# Patient Record
Sex: Male | Born: 1985 | Hispanic: No | Marital: Married | Smoking: Former smoker
Health system: Southern US, Community
[De-identification: ages and names within clinical notes are randomized; demographics above are authoritative.]

## PROBLEM LIST (undated history)

## (undated) DIAGNOSIS — R7303 Prediabetes: Secondary | ICD-10-CM

## (undated) DIAGNOSIS — M549 Dorsalgia, unspecified: Secondary | ICD-10-CM

## (undated) DIAGNOSIS — G8929 Other chronic pain: Secondary | ICD-10-CM

## (undated) DIAGNOSIS — E7800 Pure hypercholesterolemia, unspecified: Secondary | ICD-10-CM

## (undated) HISTORY — DX: Pure hypercholesterolemia, unspecified: E78.00

## (undated) HISTORY — PX: SKIN GRAFT: SHX250

## (undated) HISTORY — DX: Prediabetes: R73.03

---
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
## 2007-06-21 ENCOUNTER — Emergency Department (HOSPITAL_COMMUNITY): Admission: EM | Admit: 2007-06-21 | Discharge: 2007-06-21 | Payer: Self-pay | Admitting: Emergency Medicine

## 2007-09-13 ENCOUNTER — Emergency Department (HOSPITAL_COMMUNITY): Admission: EM | Admit: 2007-09-13 | Discharge: 2007-09-13 | Payer: Self-pay | Admitting: Emergency Medicine

## 2008-04-11 ENCOUNTER — Emergency Department (HOSPITAL_COMMUNITY): Admission: EM | Admit: 2008-04-11 | Discharge: 2008-04-11 | Payer: Self-pay | Admitting: Emergency Medicine

## 2010-02-04 ENCOUNTER — Emergency Department (HOSPITAL_COMMUNITY): Admission: EM | Admit: 2010-02-04 | Discharge: 2010-02-04 | Payer: Self-pay | Admitting: Emergency Medicine

## 2010-07-12 ENCOUNTER — Emergency Department (HOSPITAL_COMMUNITY): Admission: EM | Admit: 2010-07-12 | Discharge: 2010-07-12 | Payer: Self-pay | Admitting: Emergency Medicine

## 2013-04-29 ENCOUNTER — Encounter (HOSPITAL_COMMUNITY): Payer: Self-pay | Admitting: Emergency Medicine

## 2013-04-29 ENCOUNTER — Emergency Department (HOSPITAL_COMMUNITY)
Admission: EM | Admit: 2013-04-29 | Discharge: 2013-04-29 | Disposition: A | Discharge status: Home / Self Care | Payer: BC Managed Care – PPO | Attending: Emergency Medicine | Admitting: Emergency Medicine

## 2013-04-29 ENCOUNTER — Emergency Department (HOSPITAL_COMMUNITY): Payer: BC Managed Care – PPO

## 2013-04-29 DIAGNOSIS — M545 Low back pain, unspecified: Secondary | ICD-10-CM | POA: Insufficient documentation

## 2013-04-29 DIAGNOSIS — G8929 Other chronic pain: Secondary | ICD-10-CM | POA: Insufficient documentation

## 2013-04-29 DIAGNOSIS — F17200 Nicotine dependence, unspecified, uncomplicated: Secondary | ICD-10-CM | POA: Insufficient documentation

## 2013-04-29 HISTORY — DX: Dorsalgia, unspecified: M54.9

## 2013-04-29 HISTORY — DX: Other chronic pain: G89.29

## 2013-04-29 MED ORDER — HYDROCODONE-ACETAMINOPHEN 5-325 MG PO TABS: ORAL_TABLET | ORAL | Status: DC

## 2013-04-29 MED ORDER — NAPROXEN 250 MG PO TABS: 250.0000 mg | ORAL_TABLET | Freq: Two times a day (BID) | ORAL | Status: DC

## 2013-04-29 MED ORDER — METHOCARBAMOL 500 MG PO TABS
1000.0000 mg | ORAL_TABLET | Freq: Four times a day (QID) | ORAL | Status: DC | PRN
Start: 2013-04-29 — End: 2014-04-23

## 2013-04-29 NOTE — ED Notes (Signed)
Pt c/o L sided sciatica, onset after lifting a battery and twisting.

## 2013-04-29 NOTE — ED Provider Notes (Signed)
History     CSN: [id number]  Arrival date & time 04/29/13  1124   First MD Initiated Contact with Patient 04/29/13 1136      Chief Complaint  Patient presents with  . Back Pain     HPI Pt was seen at 1145.  Per pt, c/o gradual onset and persistence of constant acute flair of his chronic low back "pain" for the past several days. States the pain began after he "lifted a battery up from the floor."  Denies any change in his usual chronic pain pattern.  Describes the pain as "spasms."  Pain worsens with palpation of the area and body position changes. Denies incont/retention of bowel or bladder, no saddle anesthesia, no focal motor weakness, no tingling/numbness in extremities, no fevers, no injury, no abd pain.   The symptoms have been associated with no other complaints. The patient has a significant history of similar symptoms previously, recently being evaluated for this complaint and multiple prior evals for same.     Past Medical History  Diagnosis Date  . Chronic back pain     History reviewed. No pertinent past surgical history.  Family History  Problem Relation Age of Onset  . Hypertension Other     History  Substance Use Topics  . Smoking status: Current Every Day Smoker -- 0.50 packs/day    Types: Cigarettes  . Smokeless tobacco: Never Used  . Alcohol Use: 1.8 oz/week    3 Cans of beer per week      Review of Systems ROS: Statement: All systems negative except as marked or noted in the HPI; Constitutional: Negative for fever and chills. ; ; Eyes: Negative for eye pain, redness and discharge. ; ; ENMT: Negative for ear pain, hoarseness, nasal congestion, sinus pressure and sore throat. ; ; Cardiovascular: Negative for chest pain, palpitations, diaphoresis, dyspnea and peripheral edema. ; ; Respiratory: Negative for cough, wheezing and stridor. ; ; Gastrointestinal: Negative for nausea, vomiting, diarrhea, abdominal pain, blood in stool, hematemesis, jaundice and  rectal bleeding. . ; ; Genitourinary: Negative for dysuria, flank pain and hematuria. ; ; Musculoskeletal: +LBP. Negative for neck pain. Negative for swelling and trauma.; ; Skin: Negative for pruritus, rash, abrasions, blisters, bruising and skin lesion.; ; Neuro: Negative for headache, lightheadedness and neck stiffness. Negative for weakness, altered level of consciousness , altered mental status, extremity weakness, paresthesias, involuntary movement, seizure and syncope.       Allergies  Review of patient's allergies indicates no known allergies.  Home Medications   Current Outpatient Rx  Name  Route  Sig  Dispense  Refill  . HYDROcodone-acetaminophen (NORCO/VICODIN) 5-325 MG per tablet      1 or 2 tabs PO q6 hours prn pain   20 tablet   0   . methocarbamol (ROBAXIN) 500 MG tablet   Oral   Take 2 tablets (1,000 mg total) by mouth 4 (four) times daily as needed (muscle spasm/pain).   25 tablet   0   . naproxen (NAPROSYN) 250 MG tablet   Oral   Take 1 tablet (250 mg total) by mouth 2 (two) times daily with a meal.   14 tablet   0     BP 126/91  Pulse 62  Temp(Src) 98.3 F (36.8 C) (Oral)  Resp 16  Ht 5\' 9"  (1.753 m)  Wt 165 lb (74.844 kg)  BMI 24.36 kg/m2  SpO2 100%  Physical Exam 1150: Physical examination:  Nursing notes reviewed; Vital signs and O2 SAT  reviewed;  Constitutional: Well developed, Well nourished, Well hydrated, In no acute distress; Head:  Normocephalic, atraumatic; Eyes: EOMI, PERRL, No scleral icterus; ENMT: Mouth and pharynx normal, Mucous membranes moist; Neck: Supple, Full range of motion, No lymphadenopathy; Cardiovascular: Regular rate and rhythm, No murmur, rub, or gallop; Respiratory: Breath sounds clear & equal bilaterally, No rales, rhonchi, wheezes.  Speaking full sentences with ease, Normal respiratory effort/excursion; Chest: Nontender, Movement normal; Abdomen: Soft, Nontender, Nondistended, Normal bowel sounds; Genitourinary: No CVA  tenderness; Spine:  No midline CS, TS, LS tenderness.  +TTP L>R lumbar paraspinal muscles. No rash.;; Extremities: Pulses normal, No tenderness, No edema, No calf edema or asymmetry.; Neuro: AA&Ox3, Major CN grossly intact.  Speech clear. Strength 5/5 equal bilat UE's and LE's, including great toe dorsiflexion.  DTR 2/4 equal bilat UE's and LE's.  No gross sensory deficits.  Neg straight leg raises bilat. Climbs on and off stretcher easily by himself. Gait steady. No gross focal motor or sensory deficits in extremities.; Skin: Color normal, Warm, Dry.   ED Course  Procedures     MDM  MDM Reviewed: previous chart, nursing note and vitals     1200:  Several year hx of chronic pain.  Pt endorses acute flair of his usual long standing chronic pain today, no change from his usual chronic pain pattern.  Pt encouraged to f/u with his PMD and Pain Management doctor for good continuity of care and control of his chronic pain.  Verb understanding.         Laray Anger, DO 05/02/13 1242

## 2014-04-23 ENCOUNTER — Encounter (HOSPITAL_COMMUNITY): Payer: Self-pay | Admitting: Emergency Medicine

## 2014-04-23 ENCOUNTER — Encounter (HOSPITAL_COMMUNITY): Payer: Self-pay | Admitting: *Deleted

## 2014-04-23 ENCOUNTER — Observation Stay (HOSPITAL_COMMUNITY)
Admission: EM | Admit: 2014-04-23 | Discharge: 2014-04-24 | Disposition: A | Discharge status: Home / Self Care | Payer: BC Managed Care – PPO | Source: Intra-hospital | Attending: Psychiatry | Admitting: Psychiatry

## 2014-04-23 ENCOUNTER — Emergency Department (HOSPITAL_COMMUNITY)
Admission: EM | Admit: 2014-04-23 | Discharge: 2014-04-23 | Disposition: A | Discharge status: Other Acute Inpatient Hospital | Payer: BC Managed Care – PPO | Attending: Emergency Medicine | Admitting: Emergency Medicine

## 2014-04-23 DIAGNOSIS — F1020 Alcohol dependence, uncomplicated: Secondary | ICD-10-CM | POA: Insufficient documentation

## 2014-04-23 DIAGNOSIS — F4310 Post-traumatic stress disorder, unspecified: Secondary | ICD-10-CM | POA: Insufficient documentation

## 2014-04-23 DIAGNOSIS — F4320 Adjustment disorder, unspecified: Secondary | ICD-10-CM | POA: Insufficient documentation

## 2014-04-23 DIAGNOSIS — F17200 Nicotine dependence, unspecified, uncomplicated: Secondary | ICD-10-CM | POA: Insufficient documentation

## 2014-04-23 DIAGNOSIS — F1410 Cocaine abuse, uncomplicated: Secondary | ICD-10-CM | POA: Insufficient documentation

## 2014-04-23 DIAGNOSIS — F332 Major depressive disorder, recurrent severe without psychotic features: Secondary | ICD-10-CM | POA: Insufficient documentation

## 2014-04-23 DIAGNOSIS — G8929 Other chronic pain: Secondary | ICD-10-CM | POA: Insufficient documentation

## 2014-04-23 DIAGNOSIS — F1994 Other psychoactive substance use, unspecified with psychoactive substance-induced mood disorder: Secondary | ICD-10-CM | POA: Insufficient documentation

## 2014-04-23 DIAGNOSIS — F1210 Cannabis abuse, uncomplicated: Secondary | ICD-10-CM | POA: Insufficient documentation

## 2014-04-23 DIAGNOSIS — F1010 Alcohol abuse, uncomplicated: Principal | ICD-10-CM | POA: Insufficient documentation

## 2014-04-23 DIAGNOSIS — M549 Dorsalgia, unspecified: Secondary | ICD-10-CM | POA: Insufficient documentation

## 2014-04-23 LAB — RAPID URINE DRUG SCREEN, HOSP PERFORMED
AMPHETAMINES: NOT DETECTED
Barbiturates: NOT DETECTED
Benzodiazepines: NOT DETECTED
COCAINE: POSITIVE — AB
OPIATES: NOT DETECTED
TETRAHYDROCANNABINOL: POSITIVE — AB

## 2014-04-23 LAB — COMPREHENSIVE METABOLIC PANEL
ALBUMIN: 4.1 g/dL (ref 3.5–5.2)
ALT: 17 U/L (ref 0–53)
AST: 17 U/L (ref 0–37)
Alkaline Phosphatase: 69 U/L (ref 39–117)
BILIRUBIN TOTAL: 0.4 mg/dL (ref 0.3–1.2)
BUN: 17 mg/dL (ref 6–23)
CALCIUM: 9.4 mg/dL (ref 8.4–10.5)
CHLORIDE: 102 meq/L (ref 96–112)
CO2: 23 meq/L (ref 19–32)
Creatinine, Ser: 0.98 mg/dL (ref 0.50–1.35)
GFR calc Af Amer: >90 mL/min (ref >90)
Glucose, Bld: 90 mg/dL (ref 70–99)
Potassium: 3.8 mEq/L (ref 3.7–5.3)
Sodium: 139 mEq/L (ref 137–147)
TOTAL PROTEIN: 7.5 g/dL (ref 6.0–8.3)

## 2014-04-23 LAB — CBC WITH DIFFERENTIAL/PLATELET
Basophils Absolute: 0 10*3/uL (ref 0.0–0.1)
Basophils Relative: 0 % (ref 0–1)
EOS PCT: 1 % (ref 0–5)
Eosinophils Absolute: 0.1 10*3/uL (ref 0.0–0.7)
HCT: 45.2 % (ref 39.0–52.0)
HEMOGLOBIN: 15.6 g/dL (ref 13.0–17.0)
LYMPHS PCT: 25 % (ref 12–46)
Lymphs Abs: 1.7 10*3/uL (ref 0.7–4.0)
MCH: 32.2 pg (ref 26.0–34.0)
MCHC: 34.5 g/dL (ref 30.0–36.0)
MCV: 93.4 fL (ref 78.0–100.0)
Monocytes Absolute: 0.5 10*3/uL (ref 0.1–1.0)
Monocytes Relative: 7 % (ref 3–12)
Neutro Abs: 4.7 10*3/uL (ref 1.7–7.7)
Neutrophils Relative %: 67 % (ref 43–77)
PLATELETS: 209 10*3/uL (ref 150–400)
RBC: 4.84 MIL/uL (ref 4.22–5.81)
RDW: 12.7 % (ref 11.5–15.5)
WBC: 7 10*3/uL (ref 4.0–10.5)

## 2014-04-23 LAB — ETHANOL

## 2014-04-23 MED ORDER — TRAZODONE HCL 50 MG PO TABS
50.0000 mg | ORAL_TABLET | Freq: Every evening | ORAL | Status: DC | PRN
  Filled 2014-04-23 (×3): qty 1

## 2014-04-23 MED ORDER — ALUM & MAG HYDROXIDE-SIMETH 200-200-20 MG/5ML PO SUSP: 30.0000 mL | ORAL | Status: DC | PRN

## 2014-04-23 MED ORDER — LORAZEPAM 1 MG PO TABS: 0.0000 mg | ORAL_TABLET | Freq: Four times a day (QID) | ORAL | Status: DC

## 2014-04-23 MED ORDER — VITAMIN B-1 100 MG PO TABS
100.0000 mg | ORAL_TABLET | Freq: Every day | ORAL | Status: DC
  Filled 2014-04-23: qty 1

## 2014-04-23 MED ORDER — ACETAMINOPHEN 325 MG PO TABS
650.0000 mg | ORAL_TABLET | Freq: Four times a day (QID) | ORAL | Status: DC | PRN
Start: 2014-04-23 — End: 2014-04-24

## 2014-04-23 MED ORDER — NICOTINE 21 MG/24HR TD PT24
21.0000 mg | MEDICATED_PATCH | Freq: Once | TRANSDERMAL | Status: DC
  Administered 2014-04-23: 21 mg via TRANSDERMAL
  Filled 2014-04-23: qty 1

## 2014-04-23 MED ORDER — CHLORDIAZEPOXIDE HCL 25 MG PO CAPS: 25.0000 mg | ORAL_CAPSULE | Freq: Four times a day (QID) | ORAL | Status: DC | PRN

## 2014-04-23 MED ORDER — THIAMINE HCL 100 MG/ML IJ SOLN: 100.0000 mg | Freq: Every day | INTRAMUSCULAR | Status: DC

## 2014-04-23 MED ORDER — MAGNESIUM HYDROXIDE 400 MG/5ML PO SUSP: 30.0000 mL | Freq: Every day | ORAL | Status: DC | PRN

## 2014-04-23 MED ORDER — LORAZEPAM 1 MG PO TABS: 0.0000 mg | ORAL_TABLET | Freq: Two times a day (BID) | ORAL | Status: DC
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
## 2014-04-23 NOTE — ED Notes (Signed)
[facility]
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
## 2014-04-23 NOTE — Progress Notes (Signed)
[facility] INPATIENT:  Family/Significant Other Suicide Prevention Education  Suicide Prevention Education:  Education Completed;Jefferson Fuellicia Monger wife [phone] has been identified by the patient as the family member/significant other with whom the patient will be residing, and identified as the person(s) who will aid the patient in the event of a mental health crisis (suicidal ideations/suicide attempt).  With written consent from the patient, the family member/significant other has been provided the following suicide prevention education, prior to the and/or following the discharge of the patient.  The suicide prevention education provided includes the following:  Suicide risk factors  Suicide prevention and interventions  National Suicide Hotline telephone number  [facility] assessment telephone number  [facility] Emergency Assistance 911  [facility] and/or Residential Mobile Crisis Unit telephone number  Request made of family/significant other to:  Remove weapons (e.g., guns, rifles, knives), all items previously/currently identified as safety concern.    Remove drugs/medications (over-the-counter, prescriptions, illicit drugs), all items previously/currently identified as a safety concern.  The family member/significant other verbalizes understanding of the suicide prevention education information provided.  The family member/significant other agrees to remove the items of safety concern listed above.  Celene KrasSandra G Britaney Espaillat 04/23/2014, 11:09 PM
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
## 2014-04-23 NOTE — BH Assessment (Signed)
[facility] Assessment Progress Note      Pt has been accepted to [facility] Observation Unit.  Support paperwork signed and faxed. ED staff notified. 

## 2014-04-23 NOTE — ED Notes (Signed)
Per pt, wants detox from alcohol, crack cocaine.  States he drinks about a case of beer 5-6 x week.  Started 6 months ago when lost his job.  Still out of work.  Pt wife at bedside.

## 2014-04-23 NOTE — ED Notes (Signed)
[facility] transportation called to transport pt to [facility]. 

## 2014-04-24 DIAGNOSIS — F4320 Adjustment disorder, unspecified: Secondary | ICD-10-CM

## 2014-04-24 DIAGNOSIS — R45851 Suicidal ideations: Secondary | ICD-10-CM

## 2014-04-24 DIAGNOSIS — F1994 Other psychoactive substance use, unspecified with psychoactive substance-induced mood disorder: Secondary | ICD-10-CM

## 2014-04-24 NOTE — Progress Notes (Signed)
Pt admitted to observation unit requesting alcohol detox. Pt admits to 1 case or more of beer daily,, cocaine 2x weekly and marijuana daily use. No s/s of withdrawal noted at present . Pt reports last use of all 04/22/14. Pt denies SI/HI, -A/V hall. And verbally contracts for safety.  Pt denies prior history of treatment or detox.  Pt admits to depression related to being unemployed, financial issues, legal problems and arguments with wife.  Pt reports court date on May 07, 2014 for speeding ticket and possession of drug charge this past January that has been resolved.  Pt admitted for evaluation and stabilization. Will monitor closely.

## 2014-04-24 NOTE — Progress Notes (Signed)
Patient resting comfortably in bed. Breathing even and unlabored.

## 2014-04-24 NOTE — Progress Notes (Signed)
Patient discharged with AVS and all belongings. Patient voiced understanding of discharge instructions. Patient's mood is happy and affect bright. Patient's wife and children were waiting in the lobby to take him home. Suicide prevention education completed with patient.

## 2014-04-24 NOTE — BH Assessment (Signed)
[facility] Assessment Progress Note  After staffing pt with Claudette Headonrad Withrow, NP, it has been determined that pt does not present a life threatening danger to himself or others at this time, and that psychiatric hospitalization is not indicated for him.  Pt is in need of outpatient referrals.  I spoke to the pt and found that [place] and that he has private health insurance through his employer.  He is interested in receiving outpatient psychiatry and counseling.  He is also interested in CD-IOP, but [place], he would be unable to [facility] [place] due to conflict with his work hours.  In his discharge instructions pt was given written referrals to [facility] [place], and to [facility] [place], both for psychiatry and for counseling.  He was also given a written referral to [facility] for CD-IOP as well as psychiatry and counseling.  Finally, he was given written contact information for [facility], including their 24 hour crisis number.  Johnathan Canninghomas Luke Falero, MA Triage Specialist 04/24/2014 2 19:31
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
## 2014-04-24 NOTE — Progress Notes (Signed)
Patient awake, alert and interacting with staff and milieu. Patient states that his mood is happy and that he is eager to see his 27 year old son. Patient denies SI/HI or psychosis.

## 2014-04-24 NOTE — Discharge Instructions (Signed)
The following clinics in your community are able to provide psychiatry/medication management and counseling services:       [facility]      [address], [address]      [place], [place] [address]      Phone: [phone]       [facility]      [address]      [place], [place] [address]      Phone: [phone]  Additionally, the following clinic in [place] offers psychiatry/medication management, counseling, and substance abuse services.  If you feel that you need a structured program to help you overcome addiction problems, consider inquiring about their Chemical Dependency Intensive Outpatient Program (CD-IOP):       [facility]      [address], [place], [place] [address]      [phone]  Call one of these providers tomorrow during business hours in order to schedule an appointment as soon as possible.  If a crisis arises, immediate help is available by calling [facility], 24 hours a day, 7 days a week:       [facility]      [phone]

## 2014-04-24 NOTE — [facility] Suicide Risk Assessment (Cosign Needed)
Suicide Risk Assessment  Admission Assessment     Nursing information obtained from:  Patient Demographic factors:  Male;Adolescent or young adult;Caucasian;Unemployed Current Mental Status:  NA Loss Factors:  Legal issues;Financial problems / change in socioeconomic status;Decrease in vocational status Historical Factors:  Family history of mental illness or substance abuse Risk Reduction Factors:  Living with another person, especially a relative;Positive social support;Responsible for children under 27 years of age Total Time spent with patient: 45 minutes  CLINICAL FACTORS:   Depression:   Comorbid alcohol abuse/dependence Alcohol/Substance Abuse/Dependencies  Psychiatric Specialty Exam:     Blood pressure 104/66, pulse 59, temperature 97.8 F (36.6 C), temperature source Oral, resp. rate 18, height 5\' 9"  (1.753 m), weight 83.462 kg (184 lb).Body mass index is 27.16 kg/(m^2).  General Appearance: Casual  Eye Contact::  Good  Speech:  Clear and Coherent  Volume:  Normal  Mood:  Anxious  Affect:  Appropriate  Thought Process:  Circumstantial  Orientation:  Full (Time, Place, and Person)  Thought Content:  Negative  Suicidal Thoughts:  Yes.  without intent/plan  Homicidal Thoughts:  No  Memory:  Immediate;   Good  Judgement:  Impaired  Insight:  Lacking  Psychomotor Activity:  Negative  Concentration:  Good  Recall:  Good  Fund of Knowledge:Good  Language: Good  Akathisia:  Negative  Handed:  Right  AIMS (if indicated):     Assets:  Desire for Improvement  Sleep:      Musculoskeletal: Strength & Muscle Tone: within normal limits Gait & Station: normal Patient leans: N/A  COGNITIVE FEATURES THAT CONTRIBUTE TO RISK:  Thought constriction (tunnel vision)    SUICIDE RISK:   Minimal: No identifiable suicidal ideation.  Patients presenting with no risk factors but with morbid ruminations; may be classified as minimal risk based on the severity of the depressive  symptoms  PLAN OF CARE:  I certify that inpatient services furnished can reasonably be expected to improve the patient's condition for alcohol and poly-substance abuse  Kerry HoughSpencer E Jie Stickels 04/24/2014, 2:04 AM

## 2014-04-26 NOTE — H&P (Signed)
[facility]. Patient will be monitored closely and has been placed on the detox protocol
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
## 2014-04-29 NOTE — [facility] Suicide Risk Assessment (Cosign Needed)
Suicide Risk Assessment  Discharge Assessment     Nursing information obtained from:  Patient Demographic factors:  Male;Adolescent or young adult;Caucasian;Unemployed Current Mental Status:  NA Loss Factors:  Legal issues;Financial problems / change in socioeconomic status;Decrease in vocational status Historical Factors:  Family history of mental illness or substance abuse Risk Reduction Factors:  Living with another person, especially a relative;Positive social support;Responsible for children under 58 years of age Total Time spent with patient: 40 minutes  CLINICAL FACTORS:   Depression:   Comorbid alcohol abuse/dependence Alcohol/Substance Abuse/Dependencies  Psychiatric Specialty Exam:     Blood pressure 118/74, pulse 78, temperature 97.4 F (36.3 C), temperature source Oral, resp. rate 16, height 5\' 9"  (1.753 m), weight 83.462 kg (184 lb).Body mass index is 27.16 kg/(m^2).   General Appearance: Casual  Eye Contact::  Good  Speech:  Clear and Coherent  Volume:  Normal  Mood:  Anxious  Affect:  Appropriate  Thought Process:  Circumstantial  Orientation:  Full (Time, Place, and Person)  Thought Content:  Negative  Suicidal Thoughts:  No   Homicidal Thoughts:  No  Memory:  Immediate;   Good  Judgement:  Impaired  Insight:  Lacking  Psychomotor Activity:  Negative  Concentration:  Fair  Recall:  Good  Fund of Knowledge:Fair  Language: Good  Akathisia:  Negative  Handed:  Right  AIMS (if indicated):     Assets:  Desire for Improvement  Sleep:      Musculoskeletal: Strength & Muscle Tone: within normal limits Gait & Station: normal Patient leans: N/A  COGNITIVE FEATURES THAT CONTRIBUTE TO RISK:  Thought constriction (tunnel vision)    SUICIDE RISK:   Minimal: No identifiable suicidal ideation.  Patients presenting with no risk factors but with morbid ruminations; may be classified as minimal risk based on the severity of the depressive symptoms  PLAN OF  CARE:  I certify that inpatient services furnished can reasonably be expected to improve the patient's condition for alcohol and poly-substance abuse  Beau Fanny, FNP-BC 04/24/2014, 6:29 PM

## 2014-05-01 NOTE — Discharge Summary (Signed)
Case discussed, and agree with plan 

## 2015-01-31 ENCOUNTER — Encounter (HOSPITAL_COMMUNITY): Payer: Self-pay | Admitting: Emergency Medicine

## 2015-01-31 ENCOUNTER — Emergency Department (HOSPITAL_COMMUNITY)
Admission: EM | Admit: 2015-01-31 | Discharge: 2015-01-31 | Disposition: A | Discharge status: Home / Self Care | Payer: Self-pay | Attending: Emergency Medicine | Admitting: Emergency Medicine

## 2015-01-31 DIAGNOSIS — K122 Cellulitis and abscess of mouth: Secondary | ICD-10-CM | POA: Insufficient documentation

## 2015-01-31 DIAGNOSIS — G8929 Other chronic pain: Secondary | ICD-10-CM | POA: Insufficient documentation

## 2015-01-31 DIAGNOSIS — Z720 Tobacco use: Secondary | ICD-10-CM | POA: Insufficient documentation

## 2015-01-31 LAB — COMPREHENSIVE METABOLIC PANEL
ALT: 21 U/L (ref 0–53)
ANION GAP: 8 (ref 5–15)
AST: 19 U/L (ref 0–37)
Albumin: 4 g/dL (ref 3.5–5.2)
Alkaline Phosphatase: 76 U/L (ref 39–117)
BILIRUBIN TOTAL: 0.8 mg/dL (ref 0.3–1.2)
BUN: 6 mg/dL (ref 6–23)
CHLORIDE: 104 mmol/L (ref 96–112)
CO2: 25 mmol/L (ref 19–32)
CREATININE: 0.83 mg/dL (ref 0.50–1.35)
Calcium: 9.6 mg/dL (ref 8.4–10.5)
GFR calc Af Amer: >90 mL/min (ref >90)
GLUCOSE: 112 mg/dL — AB (ref 70–99)
Potassium: 3.9 mmol/L (ref 3.5–5.1)
Sodium: 137 mmol/L (ref 135–145)
Total Protein: 8.4 g/dL — ABNORMAL HIGH (ref 6.0–8.3)

## 2015-01-31 LAB — CBC WITH DIFFERENTIAL/PLATELET
BASOS ABS: 0 10*3/uL (ref 0.0–0.1)
Basophils Relative: 0 % (ref 0–1)
EOS PCT: 0 % (ref 0–5)
Eosinophils Absolute: 0 10*3/uL (ref 0.0–0.7)
HCT: 48.3 % (ref 39.0–52.0)
Hemoglobin: 16.6 g/dL (ref 13.0–17.0)
LYMPHS ABS: 0.9 10*3/uL (ref 0.7–4.0)
Lymphocytes Relative: 7 % — ABNORMAL LOW (ref 12–46)
MCH: 32.9 pg (ref 26.0–34.0)
MCHC: 34.4 g/dL (ref 30.0–36.0)
MCV: 95.8 fL (ref 78.0–100.0)
MONOS PCT: 2 % — AB (ref 3–12)
Monocytes Absolute: 0.2 10*3/uL (ref 0.1–1.0)
Neutro Abs: 11.2 10*3/uL — ABNORMAL HIGH (ref 1.7–7.7)
Neutrophils Relative %: 91 % — ABNORMAL HIGH (ref 43–77)
Platelets: 238 10*3/uL (ref 150–400)
RBC: 5.04 MIL/uL (ref 4.22–5.81)
RDW: 12.2 % (ref 11.5–15.5)
WBC: 12.3 10*3/uL — ABNORMAL HIGH (ref 4.0–10.5)

## 2015-01-31 LAB — RAPID STREP SCREEN (MED CTR MEBANE ONLY): STREPTOCOCCUS, GROUP A SCREEN (DIRECT): NEGATIVE

## 2015-01-31 MED ORDER — CLINDAMYCIN HCL 300 MG PO CAPS: 150.0000 mg | ORAL_CAPSULE | Freq: Three times a day (TID) | ORAL | Status: DC

## 2015-01-31 MED ORDER — FAMOTIDINE IN NACL 20-0.9 MG/50ML-% IV SOLN
20.0000 mg | Freq: Once | INTRAVENOUS | Status: AC
  Administered 2015-01-31: 20 mg via INTRAVENOUS
  Filled 2015-01-31: qty 50

## 2015-01-31 MED ORDER — DIPHENHYDRAMINE HCL 50 MG/ML IJ SOLN
50.0000 mg | Freq: Once | INTRAMUSCULAR | Status: AC
  Administered 2015-01-31: 50 mg via INTRAVENOUS
  Filled 2015-01-31: qty 1

## 2015-01-31 MED ORDER — METHYLPREDNISOLONE SODIUM SUCC 125 MG IJ SOLR
125.0000 mg | Freq: Once | INTRAMUSCULAR | Status: AC
  Administered 2015-01-31: 125 mg via INTRAVENOUS
  Filled 2015-01-31: qty 2

## 2015-01-31 NOTE — ED Provider Notes (Signed)
CSN: [id number]     Arrival date & time 01/31/15  [id number] History  This chart was scribed for Audree CamelScott T Shakerra Red, MD by Ronney LionSuzanne Le, ED Scribe. This patient was seen in room APA03/APA03 and the patient's care was started at 7:38 AM.    Chief Complaint  Patient presents with  . Sore Throat   The history is provided by the patient. No language interpreter was used.     HPI Comments: Johnathan Bautista is a 29 y.o. male who presents to the Emergency Department complaining of right sided sore throat, with onset 4 days ago. He states he felt his throat swelling last night, and he looked at his throat with a light and actually saw that his uvula looked swollen this morning. No fevers. Patient has taken Nyquil, Dayquil, and Alka Seltzer with no relief. He feels like he has trouble breathing when leaning back or lying flat. He denies fever, vomiting, or trouble swallowing. Does have painful swallowing. No drooling  Past Medical History  Diagnosis Date  . Chronic back pain    History reviewed. No pertinent past surgical history. Family History  Problem Relation Age of Onset  . Hypertension Other    History  Substance Use Topics  . Smoking status: Current Every Day Smoker -- 0.50 packs/day    Types: Cigarettes  . Smokeless tobacco: Never Used  . Alcohol Use: 7.2 oz/week    12 Cans of beer per week    Review of Systems  Constitutional: Negative for fever.  HENT: Positive for sore throat. Negative for trouble swallowing and voice change.   Respiratory: Negative for shortness of breath.   Gastrointestinal: Negative for vomiting.  All other systems reviewed and are negative.     Allergies  Review of patient's allergies indicates no known allergies.  Home Medications   Prior to Admission medications   Not on File   BP 132/93 mmHg  Pulse 91  Temp(Src) 98.2 F (36.8 C) (Oral)  Resp 18  Ht 5\' 8"  (1.727 m)  Wt 190 lb (86.183 kg)  BMI 28.90 kg/m2  SpO2 95% Physical Exam  Constitutional:  He is oriented to person, place, and time. He appears well-developed and well-nourished. No distress.  HENT:  Head: Normocephalic and atraumatic.  Right Ear: External ear normal.  Left Ear: External ear normal.  Nose: Nose normal.  Mouth/Throat: No oropharyngeal exudate.  Diffusely swollen and edematous uvula which is midline. Right sided tonsil visible but just medial to tonsil is thickening of palate c/w early PTA. No drainage. No drooling  Neck: Neck supple. No tracheal deviation present.  Cardiovascular: Normal rate, regular rhythm and normal heart sounds.   Pulmonary/Chest: Effort normal. No stridor. No respiratory distress. He has no wheezes.  Abdominal: He exhibits no distension.  Musculoskeletal: He exhibits no edema.  Neurological: He is alert and oriented to person, place, and time.  Skin: Skin is warm and dry. No erythema.  Psychiatric: He has a normal mood and affect. His behavior is normal.  Nursing note and vitals reviewed.   ED Course  Procedures (including critical care time)  DIAGNOSTIC STUDIES: Oxygen Saturation is 95% on room air, adequate by my interpretation.    COORDINATION OF CARE: 7:42 AM - Discussed treatment plan with pt at bedside which includes consult with an ENT specialist, and pt agreed to plan.   Labs Review Labs Reviewed - No data to display  Imaging Review No results found.   EKG Interpretation None  MDM   Final diagnoses:  Uvulitis    Patient's exam looks consistent with uvulitis. He notes no change in his voice and has no stridor or respiratory distress. Patient also may be has an early PTA. I discussed the case with ENT on call, Dr. Pollyann Kennedy, who recommends him to come to the [facility] ER for his evaluation. Given this is possibly angioedema he was treated with Solu-Medrol, Pepcid, and Benadryl. No stridor or respiratory distress to suggest needing epinephrine. No need for immediate airway management as he is talking and breathing  normally. I discussed the transfer with the patient who agrees to be transferred but declines EMS transfer. I strongly recommended that he allow [place] to transfer him by ambulance given the concern for possible potential airway emergency. He voiced understanding of these concerns but states that he really doesn't want to go by ambulance. He is having his cousin drive him. I discussed with him multiple times about this but he still refuses ambulance. At this point he has remained stable on the ED and states his symptoms are not worsening since last night. He will transfer to the [facility], I told him not to eat or drink. Discussed with the [facility] attending, Dr. Radford Pax, who is aware patient coming.  I personally performed the services described in this documentation, which was scribed in my presence. The recorded information has been reviewed and is accurate.    Audree Camel, MD 01/31/15 [phone]

## 2015-01-31 NOTE — Discharge Instructions (Signed)
Uvulitis  Uvulitis is redness and soreness (inflammation) of the uvula. The uvula is the small tongue-shaped piece of tissue in the back of your mouth.   CAUSES  Infection is a common cause of uvulitis. Infection of the uvula can be either viral or bacterial. Infectious uvulitis usually only occurs in association with another condition, such as inflammation and infection of the mouth or throat.   Other causes of uvulitis include:  · Trauma to the uvula.  · Swelling from excess fluid buildup (edema), which may be an allergic reaction.  · Inhalation of irritants, such as chemical agents, smoke, or steam.  DIAGNOSIS  Your caregiver can usually diagnose uvulitis through a physical examination. Bacterial uvulitis can be diagnosed through the results of the growth of samples of bodily substances taken from your mouth (cultures).  HOME CARE INSTRUCTIONS   · Rest as much as possible.  · Young children may suck on frozen juice bars or frozen ice pops. Older children and adults may gargle with a warm or cold liquid to help soothe the throat. (Mix ¼ tsp of salt in 8 oz of water, or use strong tea.)  · Use a cool-mist humidifier to lessen throat irritation and cough.  · Drink enough fluids to keep your urine clear or pale yellow.  · While the throat is very sore, eat soft or liquid foods such as milk, ice cream, soups, or milk drinks.  · Family members who develop a sore throat or fever should have a medical exam or throat culture.  · If your child has uvulitis and is taking antibiotic medicine, wait 24 hours or until his or her temperature is near normal (less than 100° F [37.8° C]) before allowing him or her to return to school or day care.  · Only take over-the-counter or prescription medicines for pain, discomfort, or fever as directed by your caregiver.  Ask when your test results will be ready. Make sure you get your test results.   SEEK MEDICAL CARE IF:   · You have an oral temperature above 102° F (38.9° C).  · You  develop large, tender lumps your the neck.  · Your child develops a rash.  · You cough up green, yellow-brown, or bloody substances.  SEEK IMMEDIATE MEDICAL CARE IF:   · You develop any new symptoms, such as vomiting, earache, severe headache, stiff neck, chest pain, or trouble breathing or swallowing.  · Your airway is blocked.  · You develop more severe throat pain along with drooling or voice changes.  Document Released: 07/03/2004 Document Revised: 02/15/2012 Document Reviewed: 01/29/2011  ExitCare® Patient Information ©2015 [employer]. This information is not intended to replace advice given to you by your health care provider. Make sure you discuss any questions you have with your health care provider.

## 2015-01-31 NOTE — Discharge Instructions (Signed)
Go straight to the [facility]. You will be seen by an ENT. It is very important he go straight there. I encouraged you to ride with the ambulance in case anything changes causing you to have trouble breathing. If you notice any trouble breathing on the way to the ER call 911 immediately. Otherwise had straight to the ER and did not eat or drink anything. Check in and they will take you to a room

## 2015-01-31 NOTE — Consult Note (Signed)
Reason for Consult: Sore throat and swelling Referring Physician: Dot Lanes, MD  Johnathan Bautista is an 29 y.o. male.  HPI: Sore throat began on Sunday. He has been feeling a little bit under the weather. He took a couple of doses of his daughters amoxicillin yesterday morning and then yesterday late, and then in the middle of the night and woke up with swelling of his uvula. He was treated medically in the emergency department at any pen and is feeling much better now. Strep screen was negative. I do not know if a formal culture has been performed.  Past Medical History  Diagnosis Date  . Chronic back pain     History reviewed. No pertinent past surgical history.  Family History  Problem Relation Age of Onset  . Hypertension Other     Social History:  reports that he has been smoking Cigarettes.  He has been smoking about 0.50 packs per day. He has never used smokeless tobacco. He reports that he drinks about 7.2 oz of alcohol per week. He reports that he does not use illicit drugs.  Allergies: No Known Allergies  Medications: Reviewed  Results for orders placed or performed during the hospital encounter of 01/31/15 (from the past 48 hour(s))  CBC with Differential     Status: Abnormal   Collection Time: 01/31/15  9:46 AM  Result Value Ref Range   WBC 12.3 (H) 4.0 - 10.5 K/uL   RBC 5.04 4.22 - 5.81 MIL/uL   Hemoglobin 16.6 13.0 - 17.0 g/dL   HCT 48.3 39.0 - 52.0 %   MCV 95.8 78.0 - 100.0 fL   MCH 32.9 26.0 - 34.0 pg   MCHC 34.4 30.0 - 36.0 g/dL   RDW 12.2 11.5 - 15.5 %   Platelets 238 150 - 400 K/uL   Neutrophils Relative % 91 (H) 43 - 77 %   Neutro Abs 11.2 (H) 1.7 - 7.7 K/uL   Lymphocytes Relative 7 (L) 12 - 46 %   Lymphs Abs 0.9 0.7 - 4.0 K/uL   Monocytes Relative 2 (L) 3 - 12 %   Monocytes Absolute 0.2 0.1 - 1.0 K/uL   Eosinophils Relative 0 0 - 5 %   Eosinophils Absolute 0.0 0.0 - 0.7 K/uL   Basophils Relative 0 0 - 1 %   Basophils Absolute 0.0 0.0 - 0.1  K/uL  Comprehensive metabolic panel     Status: Abnormal   Collection Time: 01/31/15  9:46 AM  Result Value Ref Range   Sodium 137 135 - 145 mmol/L   Potassium 3.9 3.5 - 5.1 mmol/L   Chloride 104 96 - 112 mmol/L   CO2 25 19 - 32 mmol/L   Glucose, Bld 112 (H) 70 - 99 mg/dL   BUN 6 6 - 23 mg/dL   Creatinine, Ser 0.83 0.50 - 1.35 mg/dL   Calcium 9.6 8.4 - 10.5 mg/dL   Total Protein 8.4 (H) 6.0 - 8.3 g/dL   Albumin 4.0 3.5 - 5.2 g/dL   AST 19 0 - 37 U/L   ALT 21 0 - 53 U/L   Alkaline Phosphatase 76 39 - 117 U/L   Total Bilirubin 0.8 0.3 - 1.2 mg/dL   GFR calc non Af Amer >90 >90 mL/min   GFR calc Af Amer >90 >90 mL/min    Comment: (NOTE) The eGFR has been calculated using the CKD EPI equation. This calculation has not been validated in all clinical situations. eGFR's persistently <90 mL/min signify possible  Chronic Kidney Disease.    Anion gap 8 5 - 15  Rapid strep screen     Status: None   Collection Time: 01/31/15  9:53 AM  Result Value Ref Range   Streptococcus, Group A Screen (Direct) NEGATIVE NEGATIVE    Comment: (NOTE) A Rapid Antigen test may result negative if the antigen level in the sample is below the detection level of this test. The FDA has not cleared this test as a stand-alone test therefore the rapid antigen negative result has reflexed to a Group A Strep culture.     No results found.  XUX:YBFXOVAN except as listed in admit H&P  Blood pressure 131/87, pulse 90, temperature 98.2 F (36.8 C), temperature source Oral, resp. rate 16, height '5\' 10"'  (1.778 m), weight 86.183 kg (190 lb), SpO2 95 %.  PHYSICAL EXAM: Overall appearance:  Healthy appearing, in no distress. Voice is normal. Head:  Normocephalic, atraumatic. Ears: External auditory canals are clear; tympanic membranes are intact in the middle ears are free of any effusion. Nose: External nose is healthy in appearance. Internal nasal exam free of any lesions or obstruction. Oral Cavity:  There are no  mucosal lesions or masses identified. The tonsils and uvula look normal at this point. Neuro:  No identifiable neurologic deficits. Neck: No palpable neck masses.  Studies Reviewed: none  Procedures: none   Assessment/Plan: Acute pharyngitis, strep screen was negative but that was after a couple of doses of amoxicillin. I think it is important to treat this for strep since we don't know for sure if this is a true negative. He likely had an allergic reaction to amoxicillin. I would treat him with clindamycin 300 mg 3 times a day for 10 days. He is to follow-up urgently if he has any recurrent swelling of the pharynx or any other worsening symptoms. If he has any other problems I will see him in the office.  Deette Revak 01/31/2015, 11:02 AM

## 2015-01-31 NOTE — ED Provider Notes (Signed)
His had swollen uvula and sore throat for last few days.  Patient has no stridor and is alert and talking.  Examination does reveal significantly swollen uvula.  Dr. Pollyann Kennedyosen was contacted from ENT he will come see the patient for evaluation.  Nelia Shiobert L Diann Bangerter, MD 01/31/15 [phone]

## 2015-01-31 NOTE — ED Notes (Signed)
Pt verbalizes understanding that he is to go straight to [facility] to see E[facility] specialist.  Pt states that he understands not to eat or drink anything.  Pt states again that he does not want to go by ambulance and wants to go by POV.

## 2015-01-31 NOTE — ED Notes (Signed)
Pt states that he has had a sore throat since Sunday.

## 2015-01-31 NOTE — ED Notes (Signed)
Report called to Steward DroneBrenda, RN at [facility]. PT's family to transport [facility]OV [facility] for eval by Dr. Radford PaxBeaton.

## 2015-01-31 NOTE — ED Notes (Signed)
POV transfer from [facility], c/o sore throat, uvular swelling (ENT aware), mild SOB, no distress noted, speaking in full sentences, managing secretions, A/OX4, ambulatory

## 2015-02-02 LAB — CULTURE, GROUP A STREP: STREP A CULTURE: NEGATIVE

## 2016-04-02 ENCOUNTER — Encounter (HOSPITAL_COMMUNITY): Payer: Self-pay | Admitting: Emergency Medicine

## 2016-04-02 ENCOUNTER — Emergency Department (HOSPITAL_COMMUNITY): Payer: Self-pay

## 2016-04-02 ENCOUNTER — Emergency Department (HOSPITAL_COMMUNITY)
Admission: EM | Admit: 2016-04-02 | Discharge: 2016-04-02 | Disposition: A | Discharge status: Home / Self Care | Payer: Self-pay | Attending: Emergency Medicine | Admitting: Emergency Medicine

## 2016-04-02 DIAGNOSIS — Y929 Unspecified place or not applicable: Secondary | ICD-10-CM | POA: Insufficient documentation

## 2016-04-02 DIAGNOSIS — Y939 Activity, unspecified: Secondary | ICD-10-CM | POA: Insufficient documentation

## 2016-04-02 DIAGNOSIS — S20211A Contusion of right front wall of thorax, initial encounter: Secondary | ICD-10-CM | POA: Insufficient documentation

## 2016-04-02 DIAGNOSIS — W1809XA Striking against other object with subsequent fall, initial encounter: Secondary | ICD-10-CM | POA: Insufficient documentation

## 2016-04-02 DIAGNOSIS — F17210 Nicotine dependence, cigarettes, uncomplicated: Secondary | ICD-10-CM | POA: Insufficient documentation

## 2016-04-02 DIAGNOSIS — Y999 Unspecified external cause status: Secondary | ICD-10-CM | POA: Insufficient documentation

## 2016-04-02 MED ORDER — NAPROXEN 500 MG PO TABS: 500.0000 mg | ORAL_TABLET | Freq: Two times a day (BID) | ORAL | Status: DC

## 2016-04-02 MED ORDER — HYDROCODONE-ACETAMINOPHEN 5-325 MG PO TABS: ORAL_TABLET | ORAL | Status: DC

## 2016-04-02 NOTE — Discharge Instructions (Signed)
Chest Contusion A contusion is a deep bruise. Bruises happen when an injury causes bleeding under the skin. Signs of bruising include pain, puffiness (swelling), and discolored skin. The bruise may turn blue, purple, or yellow.  HOME CARE  Put ice on the injured area.  Put ice in a plastic bag.  Place a towel between the skin and the bag.  Leave the ice on for 15-20 minutes at a time, 03-04 times a day for the first 48 hours.  Only take medicine as told by your doctor.  Rest.  Take deep breaths (deep-breathing exercises) as told by your doctor.  Stop smoking if you smoke.  Do not lift objects over 5 pounds (2.3 kilograms) for 3 days or longer if told by your doctor. GET HELP RIGHT AWAY IF:   You have more bruising or puffiness.  You have pain that gets worse.  You have trouble breathing.  You are dizzy, weak, or pass out (faint).  You have blood in your pee (urine) or poop (stool).  You cough up or throw up (vomit) blood.  Your puffiness or pain is not helped with medicines. MAKE SURE YOU:   Understand these instructions.  Will watch your condition.  Will get help right away if you are not doing well or get worse.   This information is not intended to replace advice given to you by your health care provider. Make sure you discuss any questions you have with your health care provider.   Document Released: 05/11/2008 Document Revised: 08/17/2012 Document Reviewed: 05/16/2012 Elsevier Interactive Patient Education 2016 [place].  Rib Contusion A rib contusion is a deep bruise on your rib area. Contusions are the result of a blunt trauma that causes bleeding and injury to the tissues under the skin. A rib contusion may involve bruising of the ribs and of the skin and muscles in the area. The skin overlying the contusion may turn blue, purple, or yellow. Minor injuries will give you a painless contusion, but more severe contusions may stay painful and swollen for a  few weeks. CAUSES  A contusion is usually caused by a blow, trauma, or direct force to an area of the body. This often occurs while playing contact sports. SYMPTOMS  Swelling and redness of the injured area.  Discoloration of the injured area.  Tenderness and soreness of the injured area.  Pain with or without movement. DIAGNOSIS  The diagnosis can be made by taking a medical history and performing a physical exam. An X-ray, CT scan, or MRI may be needed to determine if there were any associated injuries, such as broken bones (fractures) or internal injuries. TREATMENT  Often, the best treatment for a rib contusion is rest. Icing or applying cold compresses to the injured area may help reduce swelling and inflammation. Deep breathing exercises may be recommended to reduce the risk of partial lung collapse and pneumonia. Over-the-counter or prescription medicines may also be recommended for pain control. HOME CARE INSTRUCTIONS   Apply ice to the injured area:  Put ice in a plastic bag.  Place a towel between your skin and the bag.  Leave the ice on for 20 minutes, 2-3 times per day.  Take medicines only as directed by your health care provider.  Rest the injured area. Avoid strenuous activity and any activities or movements that cause pain. Be careful during activities and avoid bumping the injured area.  Perform deep-breathing exercises as directed by your health care provider.  Do not lift  anything that is heavier than 5 lb (2.3 kg) until your health care provider approves. °· Do not use any tobacco products, including cigarettes, chewing tobacco, or electronic cigarettes. If you need help quitting, ask your health care provider. °SEEK MEDICAL CARE IF:  °· You have increased bruising or swelling. °· You have pain that is not controlled with treatment. °· You have a fever. °SEEK IMMEDIATE MEDICAL CARE IF:  °· You have difficulty breathing or shortness of breath. °· You develop a  continual cough, or you cough up thick or bloody sputum. °· You feel sick to your stomach (nauseous), you throw up (vomit), or you have abdominal pain. °  °This information is not intended to replace advice given to you by your health care provider. Make sure you discuss any questions you have with your health care provider. °  °Document Released: 08/18/2001 Document Revised: 12/14/2014 Document Reviewed: 09/04/2014 °Elsevier Interactive Patient Education ©2016 [employer] ° °

## 2016-04-02 NOTE — ED Notes (Signed)
Pt seen and evaluated by EDpa for intial assessment.
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
## 2016-04-02 NOTE — ED Notes (Signed)
Pt states he slipped in the woods yesterday and thinks he hit his right ribs on a stump.  C/o pain with movement or palpation.  Denies sob.

## 2016-05-01 ENCOUNTER — Encounter (HOSPITAL_COMMUNITY): Payer: Self-pay | Admitting: Emergency Medicine

## 2016-05-01 ENCOUNTER — Emergency Department (HOSPITAL_COMMUNITY)
Admission: EM | Admit: 2016-05-01 | Discharge: 2016-05-01 | Disposition: A | Discharge status: Other Acute Inpatient Hospital | Payer: Medicaid Other | Attending: Emergency Medicine | Admitting: Emergency Medicine

## 2016-05-01 ENCOUNTER — Emergency Department (HOSPITAL_COMMUNITY): Payer: Medicaid Other

## 2016-05-01 DIAGNOSIS — S32000A Wedge compression fracture of unspecified lumbar vertebra, initial encounter for closed fracture: Secondary | ICD-10-CM

## 2016-05-01 DIAGNOSIS — S32029A Unspecified fracture of second lumbar vertebra, initial encounter for closed fracture: Secondary | ICD-10-CM | POA: Insufficient documentation

## 2016-05-01 DIAGNOSIS — Z23 Encounter for immunization: Secondary | ICD-10-CM | POA: Diagnosis not present

## 2016-05-01 DIAGNOSIS — S50811A Abrasion of right forearm, initial encounter: Secondary | ICD-10-CM | POA: Insufficient documentation

## 2016-05-01 DIAGNOSIS — R0789 Other chest pain: Secondary | ICD-10-CM | POA: Diagnosis not present

## 2016-05-01 DIAGNOSIS — F17210 Nicotine dependence, cigarettes, uncomplicated: Secondary | ICD-10-CM | POA: Insufficient documentation

## 2016-05-01 DIAGNOSIS — S30811A Abrasion of abdominal wall, initial encounter: Secondary | ICD-10-CM | POA: Diagnosis not present

## 2016-05-01 DIAGNOSIS — S0081XA Abrasion of other part of head, initial encounter: Secondary | ICD-10-CM | POA: Diagnosis not present

## 2016-05-01 DIAGNOSIS — Y999 Unspecified external cause status: Secondary | ICD-10-CM | POA: Insufficient documentation

## 2016-05-01 DIAGNOSIS — T07XXXA Unspecified multiple injuries, initial encounter: Secondary | ICD-10-CM

## 2016-05-01 DIAGNOSIS — Y9389 Activity, other specified: Secondary | ICD-10-CM | POA: Diagnosis not present

## 2016-05-01 DIAGNOSIS — T22352A Burn of third degree of left shoulder, initial encounter: Secondary | ICD-10-CM | POA: Diagnosis not present

## 2016-05-01 DIAGNOSIS — W228XXA Striking against or struck by other objects, initial encounter: Secondary | ICD-10-CM | POA: Insufficient documentation

## 2016-05-01 DIAGNOSIS — Y92828 Other wilderness area as the place of occurrence of the external cause: Secondary | ICD-10-CM | POA: Diagnosis not present

## 2016-05-01 DIAGNOSIS — M25512 Pain in left shoulder: Secondary | ICD-10-CM | POA: Diagnosis present

## 2016-05-01 DIAGNOSIS — X17XXXA Contact with hot engines, machinery and tools, initial encounter: Secondary | ICD-10-CM | POA: Insufficient documentation

## 2016-05-01 DIAGNOSIS — S42031A Displaced fracture of lateral end of right clavicle, initial encounter for closed fracture: Secondary | ICD-10-CM | POA: Insufficient documentation

## 2016-05-01 DIAGNOSIS — R109 Unspecified abdominal pain: Secondary | ICD-10-CM | POA: Diagnosis not present

## 2016-05-01 DIAGNOSIS — S42001A Fracture of unspecified part of right clavicle, initial encounter for closed fracture: Secondary | ICD-10-CM

## 2016-05-01 DIAGNOSIS — S40811A Abrasion of right upper arm, initial encounter: Secondary | ICD-10-CM | POA: Insufficient documentation

## 2016-05-01 DIAGNOSIS — R0602 Shortness of breath: Secondary | ICD-10-CM | POA: Diagnosis not present

## 2016-05-01 DIAGNOSIS — S30810A Abrasion of lower back and pelvis, initial encounter: Secondary | ICD-10-CM | POA: Diagnosis not present

## 2016-05-01 LAB — CBC WITH DIFFERENTIAL/PLATELET
BASOS ABS: 0 10*3/uL (ref 0.0–0.1)
BASOS PCT: 0 %
Eosinophils Absolute: 0.1 10*3/uL (ref 0.0–0.7)
Eosinophils Relative: 1 %
HEMATOCRIT: 45.7 % (ref 39.0–52.0)
HEMOGLOBIN: 16 g/dL (ref 13.0–17.0)
LYMPHS PCT: 25 %
Lymphs Abs: 3.3 10*3/uL (ref 0.7–4.0)
MCH: 33.1 pg (ref 26.0–34.0)
MCHC: 35 g/dL (ref 30.0–36.0)
MCV: 94.4 fL (ref 78.0–100.0)
MONO ABS: 0.6 10*3/uL (ref 0.1–1.0)
Monocytes Relative: 4 %
NEUTROS ABS: 9.3 10*3/uL — AB (ref 1.7–7.7)
NEUTROS PCT: 70 %
Platelets: 301 10*3/uL (ref 150–400)
RBC: 4.84 MIL/uL (ref 4.22–5.81)
RDW: 12.5 % (ref 11.5–15.5)
WBC: 13.3 10*3/uL — ABNORMAL HIGH (ref 4.0–10.5)

## 2016-05-01 LAB — BASIC METABOLIC PANEL
ANION GAP: 16 — AB (ref 5–15)
BUN: 12 mg/dL (ref 6–20)
CALCIUM: 9 mg/dL (ref 8.9–10.3)
CO2: 17 mmol/L — ABNORMAL LOW (ref 22–32)
Chloride: 102 mmol/L (ref 101–111)
Creatinine, Ser: 1.01 mg/dL (ref 0.61–1.24)
GFR calc Af Amer: >60 mL/min (ref >60)
GLUCOSE: 124 mg/dL — AB (ref 65–99)
Potassium: 3.1 mmol/L — ABNORMAL LOW (ref 3.5–5.1)
SODIUM: 135 mmol/L (ref 135–145)

## 2016-05-01 LAB — TYPE AND SCREEN
ABO/RH(D): A POS
ANTIBODY SCREEN: NEGATIVE

## 2016-05-01 LAB — HEPATIC FUNCTION PANEL
ALK PHOS: 80 U/L (ref 38–126)
ALT: 44 U/L (ref 17–63)
AST: 51 U/L — ABNORMAL HIGH (ref 15–41)
Albumin: 4.4 g/dL (ref 3.5–5.0)
BILIRUBIN INDIRECT: 0.4 mg/dL (ref 0.3–0.9)
BILIRUBIN TOTAL: 0.5 mg/dL (ref 0.3–1.2)
Bilirubin, Direct: 0.1 mg/dL (ref 0.1–0.5)
TOTAL PROTEIN: 7.5 g/dL (ref 6.5–8.1)

## 2016-05-01 LAB — LIPASE, BLOOD: Lipase: 23 U/L (ref 11–51)

## 2016-05-01 LAB — ETHANOL: ALCOHOL ETHYL (B): 176 mg/dL — AB (ref <5)

## 2016-05-01 MED ORDER — HYDROMORPHONE HCL 1 MG/ML IJ SOLN
INTRAMUSCULAR | Status: AC
  Filled 2016-05-01: qty 1

## 2016-05-01 MED ORDER — TETANUS-DIPHTH-ACELL PERTUSSIS 5-2.5-18.5 LF-MCG/0.5 IM SUSP
0.5000 mL | Freq: Once | INTRAMUSCULAR | Status: AC
  Administered 2016-05-01: 0.5 mL via INTRAMUSCULAR
  Filled 2016-05-01: qty 0.5

## 2016-05-01 MED ORDER — SODIUM CHLORIDE 0.9 % IV BOLUS (SEPSIS)
1000.0000 mL | Freq: Once | INTRAVENOUS | Status: AC
Start: 2016-05-01 — End: 2016-05-01
  Administered 2016-05-01: 1000 mL via INTRAVENOUS

## 2016-05-01 MED ORDER — HYDROMORPHONE HCL 1 MG/ML IJ SOLN
1.0000 mg | Freq: Once | INTRAMUSCULAR | Status: AC
  Administered 2016-05-01: 1 mg via INTRAVENOUS
  Filled 2016-05-01: qty 1

## 2016-05-01 MED ORDER — HYDROMORPHONE HCL 1 MG/ML IJ SOLN
1.0000 mg | Freq: Once | INTRAMUSCULAR | Status: AC
  Administered 2016-05-01: 1 mg via INTRAVENOUS

## 2016-05-01 MED ORDER — IOPAMIDOL (ISOVUE-300) INJECTION 61%
100.0000 mL | Freq: Once | INTRAVENOUS | Status: AC | PRN
  Administered 2016-05-01: 100 mL via INTRAVENOUS

## 2016-05-01 MED ORDER — FENTANYL CITRATE (PF) 100 MCG/2ML IJ SOLN
50.0000 ug | Freq: Once | INTRAMUSCULAR | Status: AC
  Administered 2016-05-01: 50 ug via INTRAVENOUS

## 2016-05-01 MED ORDER — FENTANYL CITRATE (PF) 100 MCG/2ML IJ SOLN
INTRAMUSCULAR | Status: AC
  Administered 2016-05-01: 50 ug
  Filled 2016-05-01: qty 2

## 2016-05-01 MED ORDER — SODIUM CHLORIDE 0.9 % IV SOLN
Freq: Once | INTRAVENOUS | Status: AC
  Administered 2016-05-01: 100 mL/h via INTRAVENOUS

## 2016-05-01 MED ORDER — FENTANYL CITRATE (PF) 100 MCG/2ML IJ SOLN
50.0000 ug | Freq: Once | INTRAMUSCULAR | Status: AC
  Administered 2016-05-01: 50 ug via INTRAVENOUS
  Filled 2016-05-01: qty 2

## 2016-05-01 NOTE — ED Notes (Signed)
Pt placed on 2L nasal cannula for comfort.
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
## 2016-05-01 NOTE — ED Notes (Signed)
Pt arrived to [facility] via POV and was removed from car and placed into wheelchair by friend.  Triage nurse alerted to arrival and immediately placed into ED bed 1 and Dr. Ruthy DickZachowski called to bedside and arrived immediately.  Pt completely undressed for trauma assessment.  Pt hyperventilating and unconsolable at this time.  Strong smell of etoh.
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
## 2016-12-27 ENCOUNTER — Encounter (HOSPITAL_COMMUNITY): Payer: Self-pay | Admitting: *Deleted

## 2016-12-27 ENCOUNTER — Ambulatory Visit (HOSPITAL_COMMUNITY)
Admission: EM | Admit: 2016-12-27 | Discharge: 2016-12-27 | Disposition: A | Discharge status: Home / Self Care | Payer: Medicaid Other | Attending: Emergency Medicine | Admitting: Emergency Medicine

## 2016-12-27 DIAGNOSIS — L02214 Cutaneous abscess of groin: Secondary | ICD-10-CM | POA: Diagnosis not present

## 2016-12-27 MED ORDER — CEPHALEXIN 500 MG PO CAPS: 500.0000 mg | ORAL_CAPSULE | Freq: Four times a day (QID) | ORAL | 0 refills | Status: DC

## 2016-12-27 MED ORDER — SULFAMETHOXAZOLE-TRIMETHOPRIM 800-160 MG PO TABS: 1.0000 | ORAL_TABLET | Freq: Two times a day (BID) | ORAL | 0 refills | Status: AC

## 2016-12-27 MED ORDER — TRAMADOL HCL 50 MG PO TABS: 50.0000 mg | ORAL_TABLET | Freq: Four times a day (QID) | ORAL | 0 refills | Status: DC | PRN

## 2016-12-27 NOTE — Discharge Instructions (Signed)
You have a cyst that is getting infected repeatedly. Take the antibiotics as prescribed. Wash the area well with soap and water twice a day. Use the tramadol every 8 hours as needed for severe pain. If this continues to be a problem, you may need to see a urologist to have the cyst removed.
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
## 2016-12-27 NOTE — ED Provider Notes (Signed)
[facility]    CSN: [id number] Arrival date & time: 12/27/16  1233     History   Chief Complaint Chief Complaint  Patient presents with  . Mass    HPI Johnathan Bautista is a 31 y.o. male.   HPI He is a 31 year old man here for evaluation of recurrent abscess to the right scrotum. This is been going on for several months, but more frequently in the last 2 weeks. He states it'll do well for some period of time, and then get irritated. We'll then get red and swollen and tender. It has ruptured spontaneously several times. Denies any testicular pain or swelling. No fevers.  Past Medical History:  Diagnosis Date  . Chronic back pain     Patient Active Problem List   Diagnosis Date Noted  . Substance induced mood disorder (HCC) 04/23/2014    Past Surgical History:  Procedure Laterality Date  . SKIN GRAFT         Home Medications    Prior to Admission medications   Medication Sig Start Date End Date Taking? Authorizing Provider  cephALEXin (KEFLEX) 500 MG capsule Take 1 capsule (500 mg total) by mouth 4 (four) times daily. 12/27/16   Charm RingsErin J Jermika Olden, MD  sulfamethoxazole-trimethoprim (BACTRIM DS,SEPTRA DS) 800-160 MG tablet Take 1 tablet by mouth 2 (two) times daily. 12/27/16 01/03/17  Charm RingsErin J Papa [place], MD  traMADol (ULTRAM) 50 MG tablet Take 1 tablet (50 mg total) by mouth every 6 (six) hours as needed. 12/27/16   Charm RingsErin J Tanyia Grabbe, MD    Family History Family History  Problem Relation Age of Onset  . Hypertension Other     Social History Social History  Substance Use Topics  . Smoking status: Current Every Day Smoker    Packs/day: 0.50    Types: Cigarettes  . Smokeless tobacco: Never Used  . Alcohol use 7.2 oz/week    12 Cans of beer per week     Allergies   Patient has no known allergies.   Review of Systems Review of Systems As in history of present illness  Physical Exam Triage Vital Signs ED Triage Vitals [12/27/16 1440]  Enc Vitals Group     BP  119/76     Pulse Rate 63     Resp 14     Temp 98.4 F (36.9 C)     Temp Source Oral     SpO2 99 %     Weight      Height      Head Circumference      Peak Flow      Pain Score      Pain Loc      Pain Edu?      Excl. in GC?    No data found.   Updated Vital Signs BP 119/76 (BP Location: Right Arm)   Pulse 63   Temp 98.4 F (36.9 C) (Oral)   Resp 14   SpO2 99%   Visual Acuity Right Eye Distance:   Left Eye Distance:   Bilateral Distance:    Right Eye Near:   Left Eye Near:    Bilateral Near:     Physical Exam  Constitutional: He is oriented to person, place, and time. He appears well-developed and well-nourished. No distress.  Cardiovascular: Normal rate.   Pulmonary/Chest: Effort normal.  Genitourinary: Testes normal. Right testis shows no mass, no swelling and no tenderness.     Neurological: He is alert and oriented to person, place,  and time.     UC Treatments / Results  Labs (all labs ordered are listed, but only abnormal results are displayed) Labs Reviewed - No data to display  EKG  EKG Interpretation None       Radiology No results found.  Procedures Procedures (including critical care time)  Medications Ordered in UC Medications - No data to display   Initial Impression / Assessment and Plan / UC Course  I have reviewed the triage vital signs and the nursing notes.  Pertinent labs & imaging results that were available during my care of the patient were reviewed by me and considered in my medical decision making (see chart for details).     No sign of active infection currently. Will treat with antibiotics. If this continues to be recurrent, may need to see urology.  Final Clinical Impressions(s) / UC Diagnoses   Final diagnoses:  Abscess of right groin    New Prescriptions New Prescriptions   CEPHALEXIN (KEFLEX) 500 MG CAPSULE    Take 1 capsule (500 mg total) by mouth 4 (four) times daily.   SULFAMETHOXAZOLE-TRIMETHOPRIM  (BACTRIM DS,SEPTRA DS) 800-160 MG TABLET    Take 1 tablet by mouth 2 (two) times daily.   TRAMADOL (ULTRAM) 50 MG TABLET    Take 1 tablet (50 mg total) by mouth every 6 (six) hours as needed.     Charm Rings, MD 12/27/16 [phone]

## 2016-12-27 NOTE — ED Notes (Signed)
Patient requested to go to the restroom, obtained urine specimen if needed, specimen in lab

## 2017-01-31 ENCOUNTER — Ambulatory Visit (HOSPITAL_COMMUNITY)
Admission: EM | Admit: 2017-01-31 | Discharge: 2017-01-31 | Disposition: A | Discharge status: Home / Self Care | Payer: Self-pay | Attending: Family Medicine | Admitting: Family Medicine

## 2017-01-31 ENCOUNTER — Encounter (HOSPITAL_COMMUNITY): Payer: Self-pay | Admitting: *Deleted

## 2017-01-31 DIAGNOSIS — N492 Inflammatory disorders of scrotum: Secondary | ICD-10-CM

## 2017-01-31 DIAGNOSIS — M25512 Pain in left shoulder: Secondary | ICD-10-CM

## 2017-01-31 MED ORDER — PREDNISONE 10 MG (21) PO TBPK: ORAL_TABLET | ORAL | 0 refills | Status: DC

## 2017-01-31 MED ORDER — SULFAMETHOXAZOLE-TRIMETHOPRIM 800-160 MG PO TABS: 1.0000 | ORAL_TABLET | Freq: Two times a day (BID) | ORAL | 0 refills | Status: AC
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
## 2017-01-31 NOTE — Discharge Instructions (Signed)
Your shoulder pain is most likely due to inflammation. I have prescribed a long course of steroids for your pain. Take 6 tablets today, then reduce by 1 tablet every other day till finished, (6,6,5,5,4,4,3,3,2,2,1,1). Should your pain persist, follow up with your primary care provider or an orthopedist.  With regard to your scrotal abscess, as it has spontaneously drained, I will not make further incisions in clinic. I have prescribed an antibiotic called Bactrim, take 1 tablet twice a day for 7 days. Apply warm moist heat 15 minutes at a time several times a day, continue to express any fluids or pus as it comes to the surface. Should it not heal or if infection spreads follow up with your primary care provider or return to clinic as needed.

## 2017-08-29 ENCOUNTER — Emergency Department (HOSPITAL_COMMUNITY)
Admission: EM | Admit: 2017-08-29 | Discharge: 2017-08-29 | Disposition: A | Discharge status: Home / Self Care | Payer: Self-pay | Attending: Emergency Medicine | Admitting: Emergency Medicine

## 2017-08-29 ENCOUNTER — Encounter (HOSPITAL_COMMUNITY): Payer: Self-pay | Admitting: *Deleted

## 2017-08-29 DIAGNOSIS — F17210 Nicotine dependence, cigarettes, uncomplicated: Secondary | ICD-10-CM | POA: Insufficient documentation

## 2017-08-29 DIAGNOSIS — L509 Urticaria, unspecified: Secondary | ICD-10-CM | POA: Insufficient documentation

## 2017-08-29 MED ORDER — PREDNISONE 50 MG PO TABS: ORAL_TABLET | ORAL | 0 refills | Status: DC

## 2017-08-29 MED ORDER — PREDNISONE 50 MG PO TABS
60.0000 mg | ORAL_TABLET | Freq: Once | ORAL | Status: AC
  Administered 2017-08-29: 07:00:00 60 mg via ORAL
  Filled 2017-08-29: qty 1

## 2017-08-29 MED ORDER — DIPHENHYDRAMINE HCL 25 MG PO CAPS
25.0000 mg | ORAL_CAPSULE | Freq: Once | ORAL | Status: AC
  Administered 2017-08-29: 25 mg via ORAL
  Filled 2017-08-29: qty 1

## 2017-08-29 NOTE — ED Triage Notes (Signed)
Pt reports waking up "itching" with a rash on his arms, side, neck, abdomen, thigh and back. Pt believes he also got into some poison ivy several days ago. Pt has not taken any benadryl but has used extra virgin olive oil and calamine lotion. Pt states the welts are looking better.
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
## 2018-06-13 ENCOUNTER — Emergency Department (HOSPITAL_COMMUNITY)
Admission: EM | Admit: 2018-06-13 | Discharge: 2018-06-13 | Disposition: A | Discharge status: Home / Self Care | Payer: Self-pay | Attending: Emergency Medicine | Admitting: Emergency Medicine

## 2018-06-13 ENCOUNTER — Encounter (HOSPITAL_COMMUNITY): Payer: Self-pay | Admitting: Emergency Medicine

## 2018-06-13 ENCOUNTER — Other Ambulatory Visit: Payer: Self-pay

## 2018-06-13 DIAGNOSIS — Z5321 Procedure and treatment not carried out due to patient leaving prior to being seen by health care provider: Secondary | ICD-10-CM | POA: Insufficient documentation

## 2018-06-13 DIAGNOSIS — R51 Headache: Secondary | ICD-10-CM | POA: Insufficient documentation

## 2018-06-13 NOTE — ED Notes (Signed)
Per registration pt has left the facility °

## 2018-06-13 NOTE — ED Triage Notes (Signed)
Pt c/o headache all day without any relief. Pt states this pain is worse than his normal migraines.

## 2018-06-14 NOTE — ED Notes (Signed)
Follow up call made  No answer  06/14/18  1245  s Deontray Hunnicutt rn

## 2019-02-01 ENCOUNTER — Encounter (HOSPITAL_COMMUNITY): Payer: Self-pay

## 2019-02-01 ENCOUNTER — Emergency Department (HOSPITAL_COMMUNITY)
Admission: EM | Admit: 2019-02-01 | Discharge: 2019-02-01 | Disposition: A | Discharge status: Home / Self Care | Payer: Self-pay | Attending: Emergency Medicine | Admitting: Emergency Medicine

## 2019-02-01 ENCOUNTER — Other Ambulatory Visit: Payer: Self-pay

## 2019-02-01 ENCOUNTER — Emergency Department (HOSPITAL_COMMUNITY): Payer: Self-pay

## 2019-02-01 DIAGNOSIS — F17210 Nicotine dependence, cigarettes, uncomplicated: Secondary | ICD-10-CM | POA: Insufficient documentation

## 2019-02-01 DIAGNOSIS — J40 Bronchitis, not specified as acute or chronic: Secondary | ICD-10-CM | POA: Insufficient documentation

## 2019-02-01 DIAGNOSIS — J111 Influenza due to unidentified influenza virus with other respiratory manifestations: Secondary | ICD-10-CM | POA: Insufficient documentation

## 2019-02-01 MED ORDER — ALBUTEROL SULFATE HFA 108 (90 BASE) MCG/ACT IN AERS
2.0000 | INHALATION_SPRAY | Freq: Once | RESPIRATORY_TRACT | Status: AC
  Administered 2019-02-01: 2 via RESPIRATORY_TRACT
  Filled 2019-02-01: qty 6.7
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
## 2019-02-01 NOTE — ED Triage Notes (Signed)
Pt diagnosed with flu on Friday. Pt states he has been taking Tamiflu but states he is not feeling much better. Pt states he still has a headache and his symptoms have not resolved and would like to be rechecked.

## 2019-02-01 NOTE — Discharge Instructions (Addendum)
Your examination favors resolving of influenza, but continued bronchitis.  Your chest x-ray is negative for pneumonia or other acute changes.  Please increase fluids.  Please wash hands frequently.  Use your mask until symptoms have resolved.  Use albuterol 2 puffs every 4 hours for difficulty with breathing.  Use Tylenol extra strength every 4 hours for fever, and/or aching.  Please see your physicians at [facility] medical for additional evaluation if not improving.  Return to the emergency department if any changes in your condition, problems, or concerns.

## 2019-02-10 ENCOUNTER — Other Ambulatory Visit: Payer: Self-pay

## 2019-02-10 ENCOUNTER — Emergency Department (HOSPITAL_COMMUNITY)
Admission: EM | Admit: 2019-02-10 | Discharge: 2019-02-10 | Disposition: A | Discharge status: Home / Self Care | Payer: Self-pay | Attending: Emergency Medicine | Admitting: Emergency Medicine

## 2019-02-10 ENCOUNTER — Encounter (HOSPITAL_COMMUNITY): Payer: Self-pay | Admitting: Emergency Medicine

## 2019-02-10 DIAGNOSIS — R1013 Epigastric pain: Secondary | ICD-10-CM | POA: Insufficient documentation

## 2019-02-10 DIAGNOSIS — F17210 Nicotine dependence, cigarettes, uncomplicated: Secondary | ICD-10-CM | POA: Insufficient documentation

## 2019-02-10 LAB — COMPREHENSIVE METABOLIC PANEL
ALT: 30 U/L (ref 0–44)
ANION GAP: 7 (ref 5–15)
AST: 19 U/L (ref 15–41)
Albumin: 4.4 g/dL (ref 3.5–5.0)
Alkaline Phosphatase: 59 U/L (ref 38–126)
BUN: 19 mg/dL (ref 6–20)
CHLORIDE: 106 mmol/L (ref 98–111)
CO2: 26 mmol/L (ref 22–32)
Calcium: 9.3 mg/dL (ref 8.9–10.3)
Creatinine, Ser: 1.02 mg/dL (ref 0.61–1.24)
Glucose, Bld: 93 mg/dL (ref 70–99)
POTASSIUM: 4.5 mmol/L (ref 3.5–5.1)
SODIUM: 139 mmol/L (ref 135–145)
Total Bilirubin: 0.3 mg/dL (ref 0.3–1.2)
Total Protein: 7.4 g/dL (ref 6.5–8.1)

## 2019-02-10 LAB — CBC WITH DIFFERENTIAL/PLATELET
ABS IMMATURE GRANULOCYTES: 0.02 10*3/uL (ref 0.00–0.07)
BASOS ABS: 0 10*3/uL (ref 0.0–0.1)
BASOS PCT: 0 %
Eosinophils Absolute: 0.1 10*3/uL (ref 0.0–0.5)
Eosinophils Relative: 1 %
HCT: 46.2 % (ref 39.0–52.0)
HEMOGLOBIN: 15.2 g/dL (ref 13.0–17.0)
Immature Granulocytes: 0 %
LYMPHS PCT: 23 %
Lymphs Abs: 1.6 10*3/uL (ref 0.7–4.0)
MCH: 30.8 pg (ref 26.0–34.0)
MCHC: 32.9 g/dL (ref 30.0–36.0)
MCV: 93.7 fL (ref 80.0–100.0)
Monocytes Absolute: 0.3 10*3/uL (ref 0.1–1.0)
Monocytes Relative: 4 %
NEUTROS ABS: 5.2 10*3/uL (ref 1.7–7.7)
NEUTROS PCT: 72 %
NRBC: 0 % (ref 0.0–0.2)
PLATELETS: 247 10*3/uL (ref 150–400)
RBC: 4.93 MIL/uL (ref 4.22–5.81)
RDW: 12.4 % (ref 11.5–15.5)
WBC: 7.3 10*3/uL (ref 4.0–10.5)

## 2019-02-10 LAB — LIPASE, BLOOD: LIPASE: 32 U/L (ref 11–51)

## 2019-02-10 MED ORDER — FAMOTIDINE 20 MG PO TABS: 20.0000 mg | ORAL_TABLET | Freq: Two times a day (BID) | ORAL | 0 refills | Status: DC

## 2019-02-10 MED ORDER — KETOROLAC TROMETHAMINE 30 MG/ML IJ SOLN
30.0000 mg | Freq: Once | INTRAMUSCULAR | Status: AC
  Administered 2019-02-10: 30 mg via INTRAVENOUS
  Filled 2019-02-10: qty 1

## 2019-02-10 MED ORDER — FAMOTIDINE IN NACL 20-0.9 MG/50ML-% IV SOLN
20.0000 mg | Freq: Once | INTRAVENOUS | Status: AC
  Administered 2019-02-10: 20 mg via INTRAVENOUS
  Filled 2019-02-10: qty 50

## 2019-02-10 MED ORDER — METOCLOPRAMIDE HCL 5 MG/ML IJ SOLN
10.0000 mg | Freq: Once | INTRAMUSCULAR | Status: AC
  Administered 2019-02-10: 10 mg via INTRAVENOUS
  Filled 2019-02-10: qty 2

## 2019-02-10 MED ORDER — SODIUM CHLORIDE 0.9 % IV BOLUS
1000.0000 mL | Freq: Once | INTRAVENOUS | Status: AC
  Administered 2019-02-10: 1000 mL via INTRAVENOUS
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
## 2019-02-10 NOTE — ED Triage Notes (Signed)
Pt c/o generalized abdominal pain with nausea and diarrhea since Friday. Pt also reports migraine HA. No fever.

## 2019-02-10 NOTE — Discharge Instructions (Addendum)
You were evaluated for some sharp abdominal pain and loose stools.  You had lab work here that was unremarkable.  This is likely gastritis or even a stomach ulcer so were starting you on some acid medication.  You should continue to use antacids like Tums in between meals and at bedtime.  Please limit smoking.  You should follow-up with your doctor and please return if any worsening symptoms.

## 2020-07-21 ENCOUNTER — Other Ambulatory Visit: Payer: Self-pay

## 2020-07-21 ENCOUNTER — Encounter: Payer: Self-pay | Admitting: Emergency Medicine

## 2020-07-21 ENCOUNTER — Ambulatory Visit
Admission: EM | Admit: 2020-07-21 | Discharge: 2020-07-21 | Disposition: A | Discharge status: Home / Self Care | Payer: Self-pay | Attending: Emergency Medicine | Admitting: Emergency Medicine

## 2020-07-21 DIAGNOSIS — J029 Acute pharyngitis, unspecified: Secondary | ICD-10-CM | POA: Insufficient documentation

## 2020-07-21 DIAGNOSIS — Z1152 Encounter for screening for COVID-19: Secondary | ICD-10-CM | POA: Insufficient documentation

## 2020-07-21 LAB — POCT RAPID STREP A (OFFICE): Rapid Strep A Screen: NEGATIVE

## 2020-07-21 MED ORDER — CEPACOL REGULAR STRENGTH 3 MG MT LOZG: 1.0000 | LOZENGE | OROMUCOSAL | 1 refills | Status: DC | PRN
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
## 2020-07-21 NOTE — Discharge Instructions (Addendum)
Strep test negative, will send out for culture and we will call you with results Declines test for mono at this time Get plenty of rest and push fluids Cepacol lozenges was prescribed Drink warm or cool liquids, use throat lozenges, or popsicles to help alleviate symptoms Take OTC ibuprofen or tylenol as needed for pain Follow up with PCP if symptoms persists Return or go to ER if patient has any new or worsening symptoms such as fever, chills, nausea, vomiting, worsening sore throat, cough, abdominal pain, chest pain, changes in bowel or bladder habits, etc.

## 2020-07-21 NOTE — ED Triage Notes (Signed)
Sore throat and swollen lymph nodes in neck

## 2020-07-22 LAB — NOVEL CORONAVIRUS, NAA: SARS-CoV-2, NAA: NOT DETECTED

## 2020-07-22 LAB — SARS-COV-2, NAA 2 DAY TAT

## 2020-07-24 LAB — CULTURE, GROUP A STREP (THRC): Special Requests: NORMAL

## 2020-08-23 ENCOUNTER — Other Ambulatory Visit: Payer: Self-pay

## 2020-08-23 ENCOUNTER — Ambulatory Visit
Admission: RE | Admit: 2020-08-23 | Discharge: 2020-08-23 | Disposition: A | Discharge status: Home / Self Care | Payer: Self-pay | Source: Ambulatory Visit | Attending: Emergency Medicine | Admitting: Emergency Medicine

## 2020-08-23 VITALS — BP 118/79 | HR 78 | Temp 98.6°F | Resp 17

## 2020-08-23 DIAGNOSIS — Z20822 Contact with and (suspected) exposure to covid-19: Secondary | ICD-10-CM

## 2020-08-26 LAB — NOVEL CORONAVIRUS, NAA: SARS-CoV-2, NAA: NOT DETECTED

## 2020-10-10 ENCOUNTER — Other Ambulatory Visit: Payer: Self-pay

## 2020-10-10 ENCOUNTER — Emergency Department (HOSPITAL_COMMUNITY)
Admission: EM | Admit: 2020-10-10 | Discharge: 2020-10-11 | Disposition: A | Discharge status: Home / Self Care | Payer: Self-pay | Attending: Emergency Medicine | Admitting: Emergency Medicine

## 2020-10-10 DIAGNOSIS — R109 Unspecified abdominal pain: Secondary | ICD-10-CM | POA: Insufficient documentation

## 2020-10-10 DIAGNOSIS — R5383 Other fatigue: Secondary | ICD-10-CM | POA: Insufficient documentation

## 2020-10-10 DIAGNOSIS — Z5321 Procedure and treatment not carried out due to patient leaving prior to being seen by health care provider: Secondary | ICD-10-CM | POA: Insufficient documentation

## 2020-10-10 DIAGNOSIS — R519 Headache, unspecified: Secondary | ICD-10-CM | POA: Insufficient documentation

## 2020-10-11 ENCOUNTER — Encounter (HOSPITAL_COMMUNITY): Payer: Self-pay

## 2020-10-11 ENCOUNTER — Ambulatory Visit
Admission: EM | Admit: 2020-10-11 | Discharge: 2020-10-11 | Disposition: A | Discharge status: Home / Self Care | Payer: Self-pay | Attending: Emergency Medicine | Admitting: Emergency Medicine

## 2020-10-11 ENCOUNTER — Other Ambulatory Visit: Payer: Self-pay

## 2020-10-11 DIAGNOSIS — G43101 Migraine with aura, not intractable, with status migrainosus: Secondary | ICD-10-CM

## 2020-10-11 MED ORDER — DEXAMETHASONE SODIUM PHOSPHATE 10 MG/ML IJ SOLN
10.0000 mg | Freq: Once | INTRAMUSCULAR | Status: AC
  Administered 2020-10-11: 10 mg via INTRAMUSCULAR

## 2020-10-11 MED ORDER — KETOROLAC TROMETHAMINE 30 MG/ML IJ SOLN
30.0000 mg | Freq: Once | INTRAMUSCULAR | Status: AC
  Administered 2020-10-11: 30 mg via INTRAMUSCULAR

## 2020-10-11 MED ORDER — SUMATRIPTAN SUCCINATE 25 MG PO TABS: 25.0000 mg | ORAL_TABLET | ORAL | 0 refills | Status: DC | PRN

## 2020-10-11 NOTE — Discharge Instructions (Addendum)
Migraine cocktail given in office Rest and drink plenty of fluids Use OTC medications as needed for symptomatic relief Follow up with PCP if symptoms persists Return or go to the ER if you have any new or worsening symptoms such as fever, chills, nausea, vomiting, chest pain, shortness of breath, cough, vision changes, worsening headache despite treatment, slurred speech, facial asymmetry, weakness in arms or legs, etc... 

## 2020-10-11 NOTE — ED Triage Notes (Signed)
Pt covid positive 14 days ago has had headache for several days

## 2020-10-11 NOTE — ED Triage Notes (Signed)
Pt reports having tested positive for covid 14 days ago and is still having headaches, stomach pain, and fatigue. Pt vitals are WNL. Pt in NAD.
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
## 2020-10-15 ENCOUNTER — Emergency Department (HOSPITAL_COMMUNITY)
Admission: EM | Admit: 2020-10-15 | Discharge: 2020-10-15 | Disposition: A | Discharge status: Home / Self Care | Payer: Self-pay | Attending: Emergency Medicine | Admitting: Emergency Medicine

## 2020-10-15 ENCOUNTER — Other Ambulatory Visit: Payer: Self-pay

## 2020-10-15 ENCOUNTER — Emergency Department (HOSPITAL_COMMUNITY): Payer: Self-pay

## 2020-10-15 DIAGNOSIS — G43909 Migraine, unspecified, not intractable, without status migrainosus: Secondary | ICD-10-CM | POA: Insufficient documentation

## 2020-10-15 DIAGNOSIS — Z87891 Personal history of nicotine dependence: Secondary | ICD-10-CM | POA: Insufficient documentation

## 2020-10-15 MED ORDER — PROCHLORPERAZINE EDISYLATE 10 MG/2ML IJ SOLN
10.0000 mg | Freq: Once | INTRAMUSCULAR | Status: AC
  Administered 2020-10-15: 10 mg via INTRAVENOUS
  Filled 2020-10-15: qty 2

## 2020-10-15 MED ORDER — KETOROLAC TROMETHAMINE 15 MG/ML IJ SOLN
15.0000 mg | Freq: Once | INTRAMUSCULAR | Status: AC
  Administered 2020-10-15: 15 mg via INTRAVENOUS
  Filled 2020-10-15: qty 1

## 2020-10-15 MED ORDER — SODIUM CHLORIDE 0.9 % IV BOLUS
1000.0000 mL | Freq: Once | INTRAVENOUS | Status: AC
  Administered 2020-10-15: 1000 mL via INTRAVENOUS

## 2020-10-15 MED ORDER — DIPHENHYDRAMINE HCL 50 MG/ML IJ SOLN
25.0000 mg | Freq: Once | INTRAMUSCULAR | Status: AC
  Administered 2020-10-15: 25 mg via INTRAVENOUS
  Filled 2020-10-15: qty 1
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
## 2020-10-15 NOTE — ED Triage Notes (Addendum)
Pt has had a migraine for the last 2 weeks. Went to urgent care and was give Sumatriptan for migraines.This has not helped. Sensitive to light. Has taken of these pills today. Last at 5:45 am

## 2020-10-15 NOTE — Discharge Instructions (Addendum)
You were evaluated in the Emergency Department and after careful evaluation, we did not find any emergent condition requiring admission or further testing in the hospital.  Your exam/testing today was overall reassuring. We were able to control your symptoms today with a migraine cocktail. Your CT did not show any abnormalities of your brain. We recommend continued follow-up with your regular doctors for help preventing and/or controlling your migraine headaches.  Please return to the Emergency Department if you experience any worsening of your condition.  Thank you for allowing [place] to be a part of your care.

## 2021-06-21 IMAGING — CT CT HEAD W/O CM
4 series · 16 of 47 positions shown, 18 images · non-contrast
Comparison: None.

CLINICAL DATA: Headache

EXAM:
CT HEAD WITHOUT CONTRAST
TECHNIQUE: Contiguous axial images were obtained from the base of the skull
through the vertex without intravenous contrast.

[Series 2: head w o · axial · 0.44mm/px · z∈[+90,+205]mm · 7 of 31 slices shown, 9 images]
[im 4/31  brain]
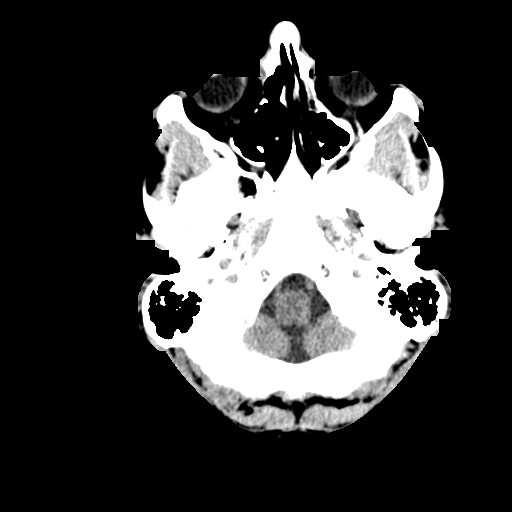
[im 4/31  bone]
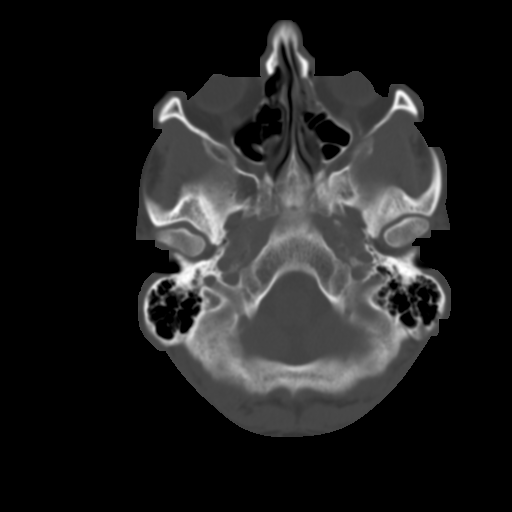
[im 8/31  brain]
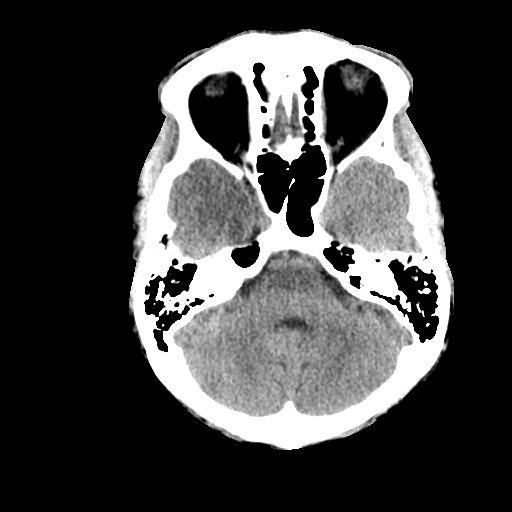
[im 12/31  brain]
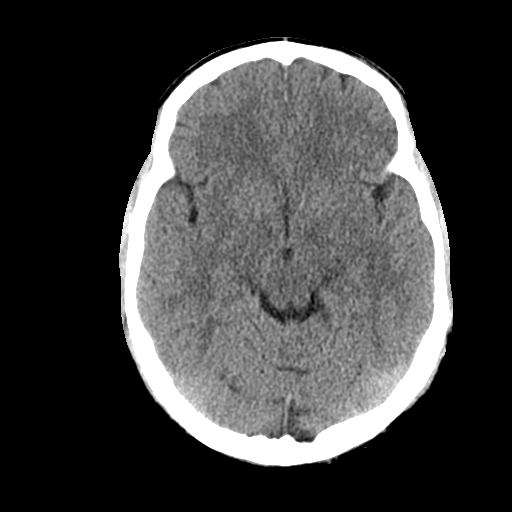
[im 16/31  brain]
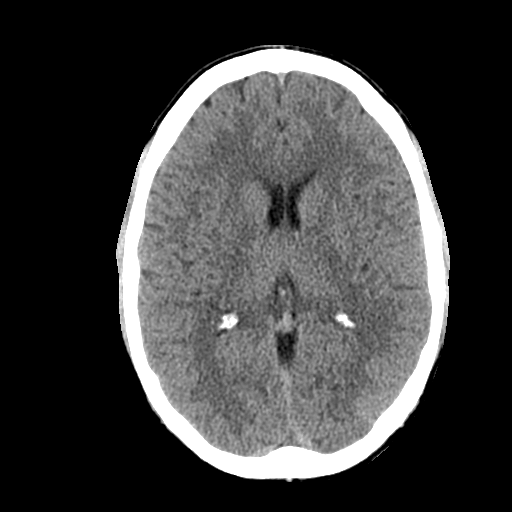
[im 19/31  brain]
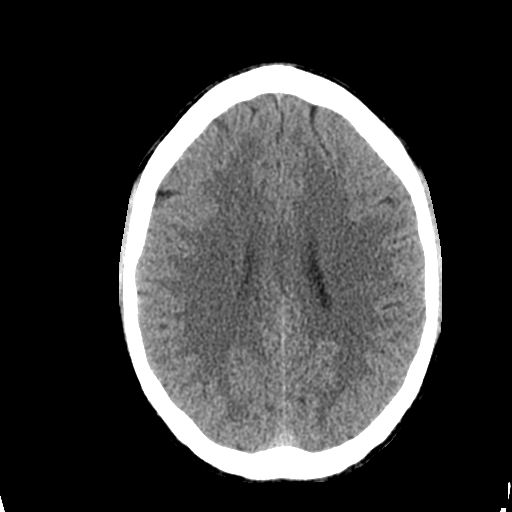
[im 19/31  bone]
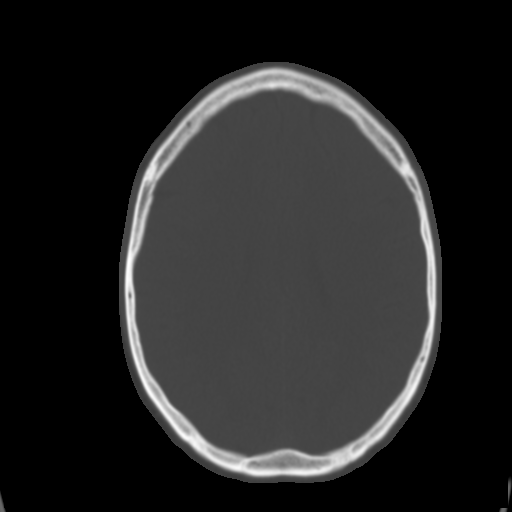
[im 23/31  brain]
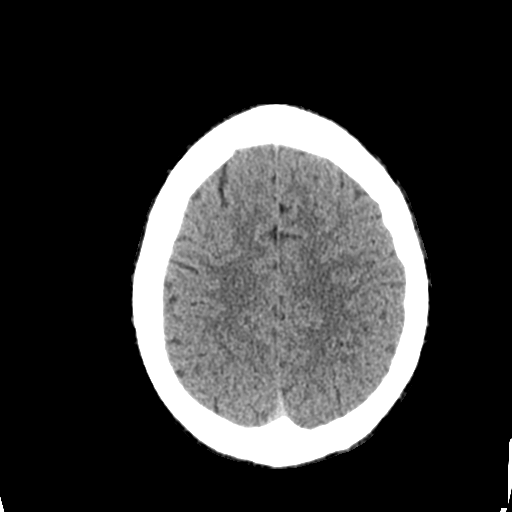
[im 27/31  brain]
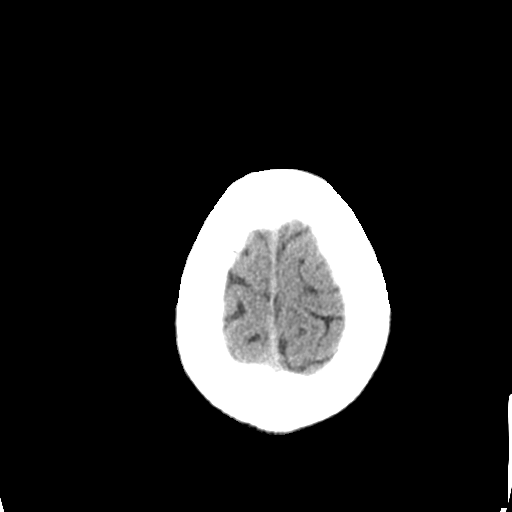

[Series 3: head bone · axial · 0.44mm/px · z∈[+89,+119]mm · 3 of 77 slices shown]
[im 8/77  bone]
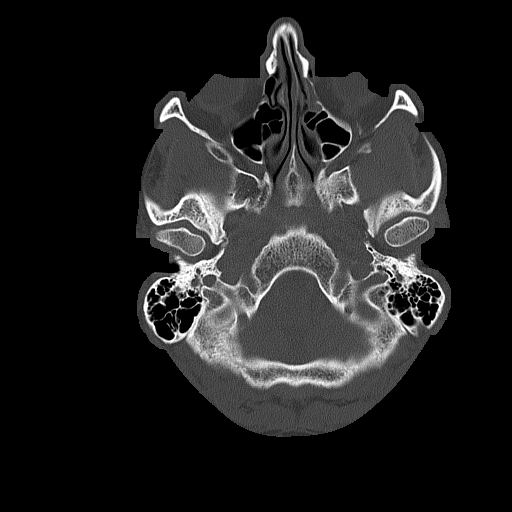
[im 16/77  bone]
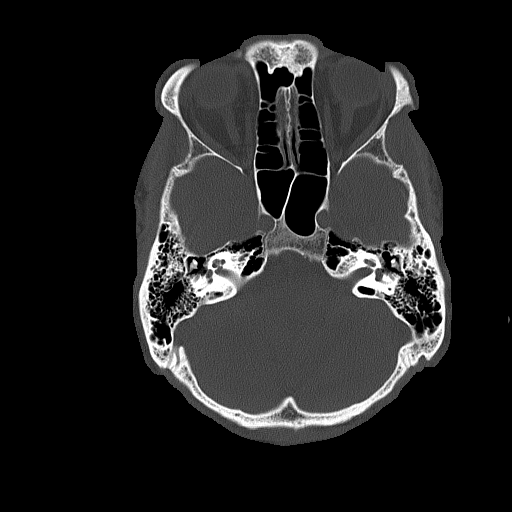
[im 23/77  bone]
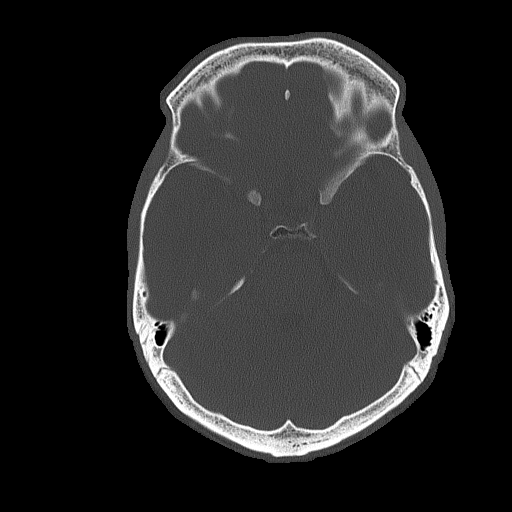

[Series 4: coronal soft · coronal · 0.32mm/px · 3 of 72 slices shown]
[im 24/72  brain]
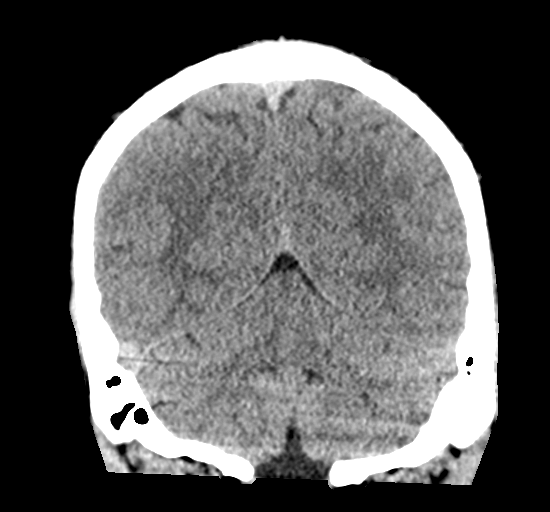
[im 32/72  brain]
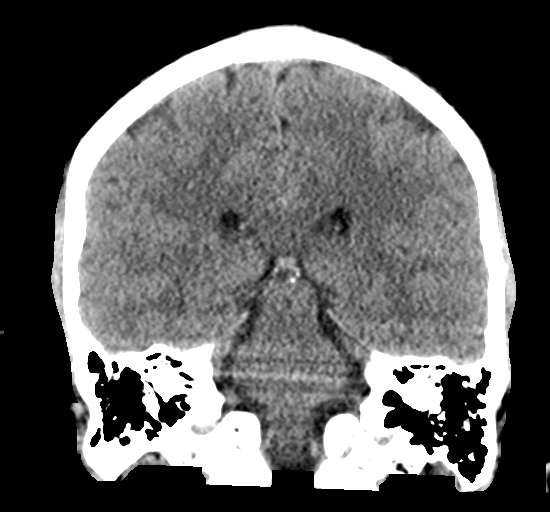
[im 40/72  brain]
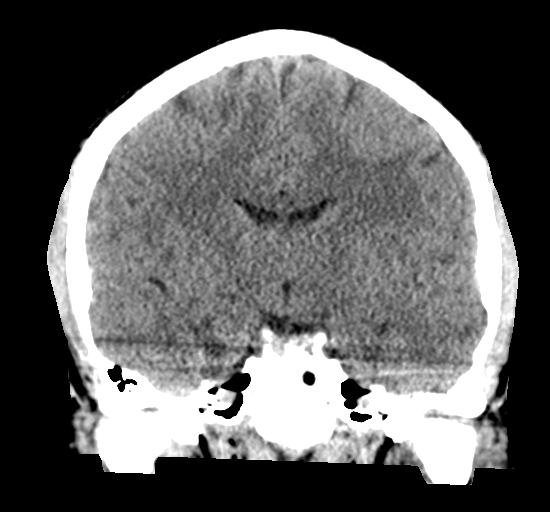

[Series 5: sagittal soft · sagittal · 0.33mm/px · 3 of 58 slices shown]
[im 20/58  brain]
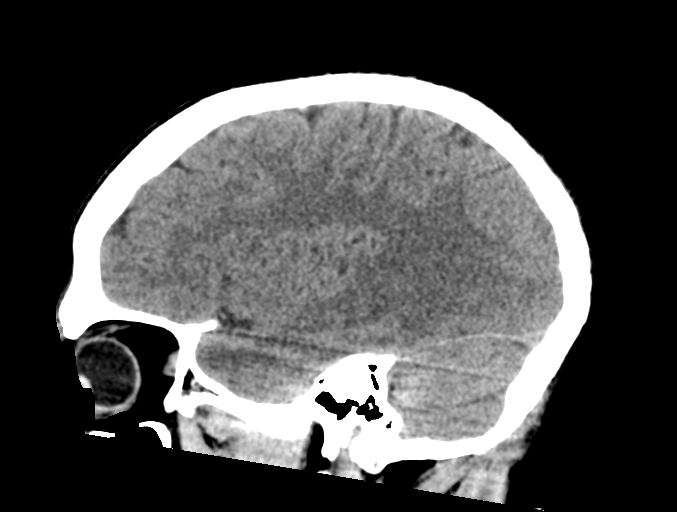
[im 29/58  brain]
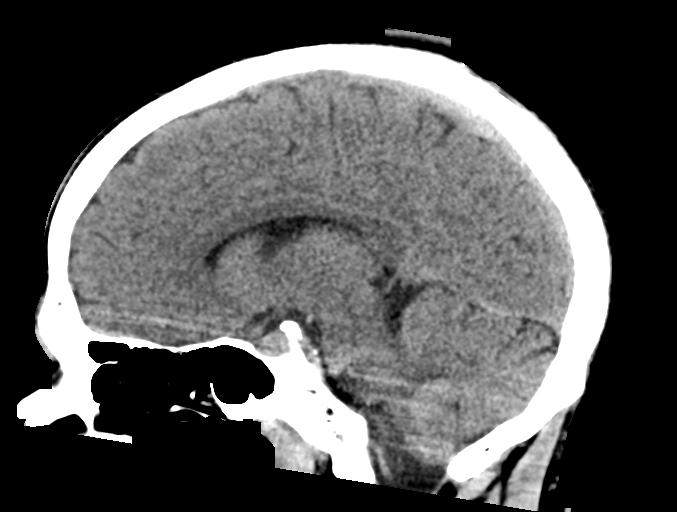
[im 39/58  brain]
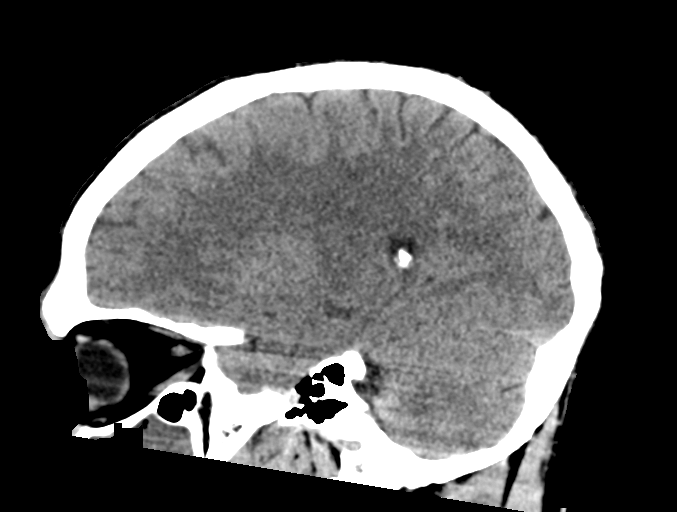

[16 of 47 positions shown; findings below may reference images not displayed]

FINDINGS: Brain: No evidence of acute infarction, hemorrhage, hydrocephalus,
extra-axial collection or mass lesion/mass effect.

Vascular: No hyperdense vessel or unexpected calcification.

Skull: Normal. Negative for fracture or focal lesion.

Sinuses/Orbits: Mucosal thickening the left maxillary sinus with
air-fluid level. Mild scattered bilateral ethmoid mucosal
thickening. Mastoid air cells are clear. Orbital structures intact.

Other: None.
IMPRESSION: 1. No acute intracranial findings.
2. Left maxillary sinus disease with air-fluid level. Correlate for
acute sinusitis.

## 2021-08-26 ENCOUNTER — Ambulatory Visit
Admission: EM | Admit: 2021-08-26 | Discharge: 2021-08-26 | Disposition: A | Discharge status: Home / Self Care | Payer: Self-pay | Attending: Family Medicine | Admitting: Family Medicine

## 2021-08-26 ENCOUNTER — Other Ambulatory Visit: Payer: Self-pay

## 2021-08-26 DIAGNOSIS — J069 Acute upper respiratory infection, unspecified: Secondary | ICD-10-CM

## 2021-08-26 MED ORDER — PROMETHAZINE-DM 6.25-15 MG/5ML PO SYRP: 5.0000 mL | ORAL_SOLUTION | Freq: Four times a day (QID) | ORAL | 0 refills | Status: DC | PRN

## 2021-08-26 NOTE — ED Triage Notes (Signed)
Pt presents with cough and nasal congestion that began Saturday, had 2 negative covid test
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
## 2021-08-27 ENCOUNTER — Ambulatory Visit: Payer: Self-pay

## 2021-12-03 ENCOUNTER — Telehealth: Payer: Self-pay | Admitting: Orthopedic Surgery

## 2021-12-03 NOTE — Telephone Encounter (Signed)
Patient/wife called to ask about an appointment for a back injury, work-related; said employer needed him to be seen and 'ok' for work. Relayed general worker's comp protocol. At this time, no employer contact nor worker's Personal assistant or approval has been received; understands needs this authorization in order to be scheduled.

## 2021-12-10 ENCOUNTER — Other Ambulatory Visit: Payer: Self-pay

## 2021-12-10 ENCOUNTER — Ambulatory Visit (INDEPENDENT_AMBULATORY_CARE_PROVIDER_SITE_OTHER): Payer: Self-pay | Admitting: Orthopaedic Surgery

## 2021-12-10 ENCOUNTER — Encounter: Payer: Self-pay | Admitting: Orthopaedic Surgery

## 2021-12-10 VITALS — Ht 68.0 in | Wt 193.5 lb

## 2021-12-10 DIAGNOSIS — G8929 Other chronic pain: Secondary | ICD-10-CM

## 2021-12-10 DIAGNOSIS — M5441 Lumbago with sciatica, right side: Secondary | ICD-10-CM

## 2021-12-10 MED ORDER — NAPROXEN 500 MG PO TABS: 500.0000 mg | ORAL_TABLET | Freq: Two times a day (BID) | ORAL | 5 refills | Status: DC

## 2021-12-10 MED ORDER — CYCLOBENZAPRINE HCL 10 MG PO TABS: 10.0000 mg | ORAL_TABLET | Freq: Every day | ORAL | 0 refills | Status: DC
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
## 2021-12-16 ENCOUNTER — Encounter: Payer: Self-pay | Admitting: Orthopaedic Surgery

## 2021-12-16 ENCOUNTER — Telehealth: Payer: Self-pay | Admitting: Orthopaedic Surgery

## 2021-12-16 NOTE — Telephone Encounter (Signed)
Message update*patient states he has mouth ulcers - pleaseadvise.

## 2021-12-16 NOTE — Telephone Encounter (Signed)
Patient left a message asking if the recent medications he was prescribed at [facility] office visit 12/10/21, Flexeril and Naprosyn; asking if he should discontinue. Please advise.

## 2021-12-16 NOTE — Telephone Encounter (Signed)
I reached patient; relayed; voiced understanding.

## 2021-12-16 NOTE — Telephone Encounter (Signed)
Per verbal response by Dr Hilda Lias to updated note, discontinue the medications, and if no better after discontinuing medications, to see primary care provider*  *Called back to patient; phone number not going through - automated message 'enter code'. Will try back.

## 2021-12-18 ENCOUNTER — Other Ambulatory Visit: Payer: Self-pay

## 2021-12-18 ENCOUNTER — Encounter: Payer: Self-pay | Admitting: Urology

## 2021-12-18 ENCOUNTER — Ambulatory Visit (INDEPENDENT_AMBULATORY_CARE_PROVIDER_SITE_OTHER): Payer: Self-pay | Admitting: Urology

## 2021-12-18 VITALS — BP 118/82 | HR 77

## 2021-12-18 DIAGNOSIS — N5082 Scrotal pain: Secondary | ICD-10-CM

## 2021-12-18 DIAGNOSIS — R8290 Unspecified abnormal findings in urine: Secondary | ICD-10-CM

## 2021-12-18 LAB — URINALYSIS, ROUTINE W REFLEX MICROSCOPIC
Bilirubin, UA: NEGATIVE
Glucose, UA: NEGATIVE
Ketones, UA: NEGATIVE
Nitrite, UA: NEGATIVE
Protein,UA: NEGATIVE
RBC, UA: NEGATIVE
Specific Gravity, UA: >1.03 — ABNORMAL HIGH (ref 1.005–1.030)
Urobilinogen, Ur: 0.2 mg/dL (ref 0.2–1.0)
pH, UA: 5 (ref 5.0–7.5)

## 2021-12-18 LAB — MICROSCOPIC EXAMINATION
RBC: NONE SEEN /hpf (ref 0–2)
Renal Epithel, UA: NONE SEEN /hpf

## 2021-12-18 MED ORDER — MELOXICAM 7.5 MG PO TABS: 7.5000 mg | ORAL_TABLET | Freq: Every day | ORAL | 1 refills | Status: DC
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
## 2021-12-18 NOTE — Progress Notes (Signed)
Urological Symptom Review  Patient is experiencing the following symptoms: none   Review of Systems  Gastrointestinal (upper)  : Negative for upper GI symptoms  Gastrointestinal (lower) : Negative for lower GI symptoms  Constitutional : Negative for symptoms  Skin: Negative for skin symptoms  Eyes: Negative for eye symptoms  Ear/Nose/Throat : Negative for Ear/Nose/Throat symptoms  Hematologic/Lymphatic: Swollen glands  Cardiovascular : Negative for cardiovascular symptoms  Respiratory : Negative for respiratory symptoms  Endocrine: Negative for endocrine symptoms  Musculoskeletal: Back pain  Neurological: Negative for neurological symptoms  Psychologic: Negative for psychiatric symptoms

## 2021-12-20 LAB — URINE CULTURE: Organism ID, Bacteria: NO GROWTH

## 2021-12-24 ENCOUNTER — Other Ambulatory Visit: Payer: Self-pay

## 2021-12-24 ENCOUNTER — Ambulatory Visit: Payer: Self-pay | Admitting: Orthopaedic Surgery

## 2021-12-25 ENCOUNTER — Ambulatory Visit: Payer: Self-pay | Admitting: Urology

## 2021-12-29 ENCOUNTER — Ambulatory Visit (INDEPENDENT_AMBULATORY_CARE_PROVIDER_SITE_OTHER): Payer: Self-pay | Admitting: Urology

## 2021-12-29 ENCOUNTER — Other Ambulatory Visit: Payer: Self-pay

## 2021-12-29 ENCOUNTER — Encounter: Payer: Self-pay | Admitting: Urology

## 2021-12-29 VITALS — BP 130/81 | HR 81 | Ht 66.0 in | Wt 180.0 lb

## 2021-12-29 DIAGNOSIS — N5082 Scrotal pain: Secondary | ICD-10-CM

## 2021-12-29 NOTE — Progress Notes (Signed)
Urological Symptom Review ° °Patient is experiencing the following symptoms: °Hard to postpone urination ° ° °Review of Systems ° °Gastrointestinal (upper)  : °Negative for upper GI symptoms ° °Gastrointestinal (lower) : °Negative for lower GI symptoms ° °Constitutional : °Negative for symptoms ° °Skin: °Negative for skin symptoms ° °Eyes: °Negative for eye symptoms ° °Ear/Nose/Throat : °Negative for Ear/Nose/Throat symptoms ° °Hematologic/Lymphatic: °Negative for Hematologic/Lymphatic symptoms ° °Cardiovascular : °Negative for cardiovascular symptoms ° °Respiratory : °Negative for respiratory symptoms ° °Endocrine: °Negative for endocrine symptoms ° °Musculoskeletal: °Back pain ° °Neurological: °Negative for neurological symptoms ° °Psychologic: °Negative for psychiatric symptoms °
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
## 2022-01-07 ENCOUNTER — Ambulatory Visit (HOSPITAL_COMMUNITY)
Admission: RE | Admit: 2022-01-07 | Discharge: 2022-01-07 | Disposition: A | Discharge status: Home / Self Care | Payer: Self-pay | Source: Ambulatory Visit | Attending: Urology | Admitting: Urology

## 2022-01-07 ENCOUNTER — Other Ambulatory Visit: Payer: Self-pay

## 2022-01-07 DIAGNOSIS — N5082 Scrotal pain: Secondary | ICD-10-CM | POA: Insufficient documentation

## 2022-01-09 ENCOUNTER — Encounter: Payer: Self-pay | Admitting: Urology

## 2022-01-15 ENCOUNTER — Ambulatory Visit: Payer: Self-pay | Admitting: Urology
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
## 2022-03-17 ENCOUNTER — Telehealth: Payer: Self-pay | Admitting: Physician Assistant

## 2022-03-17 DIAGNOSIS — L729 Follicular cyst of the skin and subcutaneous tissue, unspecified: Secondary | ICD-10-CM

## 2022-03-17 DIAGNOSIS — L089 Local infection of the skin and subcutaneous tissue, unspecified: Secondary | ICD-10-CM

## 2022-03-17 MED ORDER — AMOXICILLIN-POT CLAVULANATE 875-125 MG PO TABS: 1.0000 | ORAL_TABLET | Freq: Two times a day (BID) | ORAL | 0 refills | Status: DC

## 2022-03-17 NOTE — Patient Instructions (Addendum)
?  [name], thank you for joining Johnathan Climes, PA-C for today's virtual visit.  While this provider is not your primary care provider (PCP), if your PCP is located in our provider database this encounter information will be shared with them immediately following your visit. ? ?Consent: ?(Patient) BRANTLEY WILEY provided verbal consent for this virtual visit at the beginning of the encounter. ? ?Current Medications: ? ?Current Outpatient Medications:  ?  cyclobenzaprine (FLEXERIL) 10 MG tablet, Take 1 tablet (10 mg total) by mouth at bedtime. One tablet every night at bedtime as needed for spasm., Disp: 30 tablet, Rfl: 0  ? ?Medications ordered in this encounter:  ?No orders of the defined types were placed in this encounter. ?  ? ?*If you need refills on other medications prior to your next appointment, please contact your pharmacy* ? ?Follow-Up: ?Call back or seek an in-person evaluation if the symptoms worsen or if the condition fails to improve as anticipated. ? ?Other Instructions ?Take the antibiotic as directed with food. ?You can take Tylenol or Ibuprofen if needed. ?Start OTC Zyrtec or Xyzal to help with inflammation and swelling in the area.  ? ? ?If you have been instructed to have an in-person evaluation today at a local Urgent Care facility, please use the link below. It will take you to a list of all of our available [facility], including address, phone number and hours of operation. Please do not delay care.  ?[facility] ? ?If you or a family member do not have a primary care provider, use the link below to schedule a visit and establish care. When you choose a [facility] primary care physician or advanced practice provider, you gain a long-term partner in health. ?Find a Primary Care Provider ? ?Learn more about [facility]'s in-office and virtual care options: ?[facility] - Get Care Now  ?

## 2022-03-17 NOTE — Progress Notes (Signed)
?Virtual Visit Consent  ? ?Johnathan Bautista, you are scheduled for a virtual visit with a [facility] provider today.   ?  ?Just as with appointments in the office, your consent must be obtained to participate.  Your consent will be active for this visit and any virtual visit you may have with one of our providers in the next 365 days.   ?  ?If you have a MyChart account, a copy of this consent can be sent to you electronically.  All virtual visits are billed to your insurance company just like a traditional visit in the office.   ? ?As this is a virtual visit, video technology does not allow for your provider to perform a traditional examination.  This may limit your provider's ability to fully assess your condition.  If your provider identifies any concerns that need to be evaluated in person or the need to arrange testing (such as labs, EKG, etc.), we will make arrangements to do so.   ?  ?Although advances in technology are sophisticated, we cannot ensure that it will always work on either your end or our end.  If the connection with a video visit is poor, the visit may have to be switched to a telephone visit.  With either a video or telephone visit, we are not always able to ensure that we have a secure connection.    ? ?I need to obtain your verbal consent now.   Are you willing to proceed with your visit today?  ?  ?Johnathan Bautista has provided verbal consent on 03/17/2022 for a virtual visit (video or telephone). ?  ?Leeanne Rio, PA-C  ? ?Date: 03/17/2022 3:52 PM ? ? ?Virtual Visit via Video Note  ? ?ILeeanne Rio, connected with  Johnathan Bautista  ([id number], Nov 06, 1986) on 03/17/22 at  3:30 PM EDT by a video-enabled telemedicine application and verified that I am speaking with the correct person using two identifiers. ? ?Location: ?Patient: Virtual Visit Location Patient: Home ?Provider: Virtual Visit Location Provider: Home Office ?  ?I discussed the limitations of evaluation and  management by telemedicine and the availability of in person appointments. The patient expressed understanding and agreed to proceed.   ? ?History of Present Illness: ?Johnathan Bautista is a 36 y.o. who identifies as a male who was assigned male at birth, and is being seen today for tender "lump" behind his left ear first noted yesterday. Notes bump is firmer today but still tender and with surrounding redness. Denies fever, chills, malaise. Had COVID-19 just over a week ago but with all symptoms resolved. Denies ear pain, drainage, hearing change, etc. Denies any trauma or injury to the area. Denies tender lymph nodes in his neck.. ? ? ?HPI: HPI  ?Problems:  ?Patient Active Problem List  ? Diagnosis Date Noted  ? 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  ?Allergies:  ?Allergies  ?Allergen Reactions  ? Fish Allergy   ?  Sword fish  ? ?Medications: No current outpatient medications on file. ? ?Observations/Objective: ?Patient is well-developed, well-nourished in no acute distress.  ?Resting comfortably at home.  ?Head is normocephalic, atraumatic.  ?No labored breathing. ?Speech is clear and coherent with logical content.  ?Patient is alert and oriented at baseline.  ? ?Assessment and Plan: ?1. Infected cyst of skin ? ?Versus lymphadenitis (unlikely) or cellulitis. Supportive measures reviewed. OTC pain control discussed if needed. Rx Augmentin to take as directed. Follow-up in-person if not resolving or  for any new or worsening symptoms.  ? ?Follow Up Instructions: ?I discussed the assessment and treatment plan with the patient. The patient was provided an opportunity to ask questions and all were answered. The patient agreed with the plan and demonstrated an understanding of the instructions.  A copy of instructions were sent to the patient via MyChart unless otherwise noted below.  ? ?The patient was advised to call back or seek an in-person evaluation if the symptoms worsen or if the condition fails to  improve as anticipated. ? ?Time:  ?I spent 10 minutes with the patient via telehealth technology discussing the above problems/concerns.   ? ?Leeanne Rio, PA-C ?

## 2022-03-20 ENCOUNTER — Telehealth: Payer: Self-pay | Admitting: Radiology

## 2022-03-20 NOTE — Telephone Encounter (Signed)
Patient called, LMVM about a form he needs filled out for a loan.  Also has [employer] now, wants to schedule another appt with Dr Hilda Lias.  He will check with wife on best time for appt next week and call back to schedule.  He will drop form off here at the office today.  ?

## 2022-04-02 ENCOUNTER — Ambulatory Visit (INDEPENDENT_AMBULATORY_CARE_PROVIDER_SITE_OTHER): Payer: Self-pay | Admitting: Orthopaedic Surgery

## 2022-04-02 ENCOUNTER — Encounter: Payer: Self-pay | Admitting: Orthopaedic Surgery

## 2022-04-02 DIAGNOSIS — G8929 Other chronic pain: Secondary | ICD-10-CM

## 2022-04-02 DIAGNOSIS — M5441 Lumbago with sciatica, right side: Secondary | ICD-10-CM

## 2022-04-02 NOTE — Progress Notes (Signed)
My back still hurts. ? ?He continues to have lower back pain.  He has been out of work since 11-09-21.  He could not tolerate the Naprosyn as he had mouth ulcers. He is taking the Flexeril. ? ?He has applied for [employer] and appears to be able to have it. ? ?I will get MRI of lumbar spine. ? ?He has no new trauma, no weakness. ? ?Spine/Pelvis examination: ? Inspection:  Overall, sacoiliac joint benign and hips nontender; without crepitus or defects. ? ? Thoracic spine inspection: Alignment normal without kyphosis present ? ? Lumbar spine inspection:  Alignment  with normal lumbar lordosis, without scoliosis apparent. ? ? Thoracic spine palpation:  without tenderness of spinal processes ? ? Lumbar spine palpation: without tenderness of lumbar area; without tightness of lumbar muscles  ? ? Range of Motion: ?  Lumbar flexion, forward flexion is normal without pain or tenderness  ?  Lumbar extension is full without pain or tenderness ?  Left lateral bend is normal without pain or tenderness ?  Right lateral bend is normal without pain or tenderness ?  Straight leg raising is normal ? Strength & tone: normal ? ? Stability overall normal stability ? ?Encounter Diagnosis  ?Name Primary?  ? Chronic midline low back pain with right-sided sciatica Yes  ? ?Get MRI. ? ?Return in six weeks. ? ?Call if any problem. ? ?Precautions discussed. ? ?Electronically Signed ?Darreld Mclean, MD ?4/27/202310:29 AM ? ?

## 2022-04-02 NOTE — Patient Instructions (Signed)
Out of work 

## 2022-05-19 ENCOUNTER — Ambulatory Visit: Payer: Self-pay | Admitting: Orthopaedic Surgery

## 2022-12-02 ENCOUNTER — Ambulatory Visit: Payer: Self-pay

## 2023-02-04 ENCOUNTER — Encounter: Payer: Self-pay | Admitting: Radiology

## 2023-05-13 ENCOUNTER — Telehealth: Payer: Self-pay | Admitting: Physician Assistant

## 2023-05-13 DIAGNOSIS — J019 Acute sinusitis, unspecified: Secondary | ICD-10-CM

## 2023-05-13 DIAGNOSIS — B9789 Other viral agents as the cause of diseases classified elsewhere: Secondary | ICD-10-CM

## 2023-05-13 MED ORDER — IPRATROPIUM BROMIDE 0.03 % NA SOLN: 2.0000 | Freq: Two times a day (BID) | NASAL | 0 refills | Status: DC

## 2023-05-13 MED ORDER — AMOXICILLIN-POT CLAVULANATE 875-125 MG PO TABS: 1.0000 | ORAL_TABLET | Freq: Two times a day (BID) | ORAL | 0 refills | Status: DC

## 2023-05-13 NOTE — Patient Instructions (Addendum)
  Johnathan Bautista, thank you for joining [name], PA-C for today's virtual visit.  While this provider is not your primary care provider (PCP), if your PCP is located in our provider database this encounter information will be shared with them immediately following your visit.   A [facility] MyChart account gives you access to today's visit and all your visits, tests, and labs performed at [facility] " click here if you don't have a [name] MyChart account or go to [email or web address]  Consent: (Patient) Johnathan Bautista provided verbal consent for this virtual visit at the beginning of the encounter.  Current Medications:  Current Outpatient Medications:    amoxicillin-clavulanate (AUGMENTIN) 875-125 MG tablet, Take 1 tablet by mouth 2 (two) times daily., Disp: 14 tablet, Rfl: 0   Medications ordered in this encounter:  No orders of the defined types were placed in this encounter.    *If you need refills on other medications prior to your next appointment, please contact your pharmacy*  Follow-Up: Call back or seek an in-person evaluation if the symptoms worsen or if the condition fails to improve as anticipated.  [facility] [phone]  Other Instructions Please keep hydrated and rest. Start saline nasal rinse. Start the Atrovent nasal spray as directed. Mucinex sinus OTC.  IF symptoms are continuing to progress over the weekend, start the Augmentin, taking as directed.    If you have been instructed to have an in-person evaluation today at a local Urgent Care facility, please use the link below. It will take you to a list of all of our available [name], including address, phone number and hours of operation. Please do not delay care.  [name]  If you or a family member do not have a primary care provider, use the link below to schedule a visit and establish care. When you choose a [name]  primary care physician or advanced practice provider, you gain a long-term partner in health. Find a Primary Care Provider  Learn more about [facility]'s in-office and virtual care options: [facility] - Get Care Now

## 2023-05-13 NOTE — Progress Notes (Signed)
Virtual Visit Consent   Johnathan Bautista, you are scheduled for a virtual visit with a [facility] provider today. Just as with appointments in the office, your consent must be obtained to participate. Your consent will be active for this visit and any virtual visit you may have with one of our providers in the next 365 days. If you have a MyChart account, a copy of this consent can be sent to you electronically.  As this is a virtual visit, video technology does not allow for your provider to perform a traditional examination. This may limit your provider's ability to fully assess your condition. If your provider identifies any concerns that need to be evaluated in person or the need to arrange testing (such as labs, EKG, etc.), we will make arrangements to do so. Although advances in technology are sophisticated, we cannot ensure that it will always work on either your end or our end. If the connection with a video visit is poor, the visit may have to be switched to a telephone visit. With either a video or telephone visit, we are not always able to ensure that we have a secure connection.  By engaging in this virtual visit, you consent to the provision of healthcare and authorize for your insurance to be billed (if applicable) for the services provided during this visit. Depending on your insurance coverage, you may receive a charge related to this service.  I need to obtain your verbal consent now. Are you willing to proceed with your visit today? ALARIC BRANDER has provided verbal consent on 05/13/2023 for a virtual visit (video or telephone). [name], [place]  Date: 05/13/2023 5:33 PM  Virtual Visit via Video Note   I, [name], connected with  Johnathan Bautista  ([id number], November 01, 1986) on 05/13/23 at  6:30 PM EDT by a video-enabled telemedicine application and verified that I am speaking with the correct person using two identifiers.  Location: Patient: Virtual Visit  Location Patient: Home Provider: Virtual Visit Location Provider: Home Office   I discussed the limitations of evaluation and management by telemedicine and the availability of in person appointments. The patient expressed understanding and agreed to proceed.    History of Present Illness: Johnathan Bautista is a 37 y.o. who identifies as a male who was assigned male at birth, and is being seen today for URI symptoms that have now acutely worsened over the past 2 days. Associated with nasal congestion, drainage, chest congestion and cough. Took a home COVID test that was negative. Notes headache with some sinus pain. Denies recent travel or sick contact.   OTC -- Mucinex, zinc, Vitamin C.   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  Resting comfortably at home.  Head is normocephalic, atraumatic.  No labored breathing. Speech is clear and coherent with logical content.  Patient is alert and oriented at baseline.    Assessment and Plan: 1. Acute  viral sinusitis - ipratropium (ATROVENT) 0.03 % nasal spray; Place 2 sprays into both nostrils every 12 (twelve) hours.  Dispense: 30 mL; Refill: 0  Supportive measures and OTC medications reviewed. Atrovent per orders. Will send in Augmentin for delayed antibiotic therapy to start over weekend if symptoms are still continuing to progress.   Follow Up Instructions: I discussed the assessment and  treatment plan with the patient. The patient was provided an opportunity to ask questions and all were answered. The patient agreed with the plan and demonstrated an understanding of the instructions.  A copy of instructions were sent to the patient via MyChart unless otherwise noted below.   The patient was advised to call back or seek an in-person evaluation if the symptoms worsen or if the condition fails to improve as anticipated.  Time:  I spent 10 minutes with the patient via telehealth technology discussing the above problems/concerns.    [name], PA-C

## 2023-07-02 ENCOUNTER — Ambulatory Visit
Admission: EM | Admit: 2023-07-02 | Discharge: 2023-07-02 | Disposition: A | Discharge status: Home / Self Care | Payer: Self-pay | Attending: Family Medicine | Admitting: Family Medicine

## 2023-07-02 DIAGNOSIS — G43819 Other migraine, intractable, without status migrainosus: Secondary | ICD-10-CM

## 2023-07-02 DIAGNOSIS — R1032 Left lower quadrant pain: Secondary | ICD-10-CM

## 2023-07-02 LAB — POCT URINALYSIS DIP (MANUAL ENTRY)
Bilirubin, UA: NEGATIVE
Blood, UA: NEGATIVE
Glucose, UA: NEGATIVE mg/dL
Ketones, POC UA: NEGATIVE mg/dL
Leukocytes, UA: NEGATIVE
Nitrite, UA: NEGATIVE
Spec Grav, UA: >=1.03 — AB (ref 1.010–1.025)
Urobilinogen, UA: 0.2 E.U./dL
pH, UA: 7 (ref 5.0–8.0)

## 2023-07-02 MED ORDER — PREDNISONE 20 MG PO TABS: 60.0000 mg | ORAL_TABLET | Freq: Every day | ORAL | 0 refills | Status: DC

## 2023-07-02 MED ORDER — SUMATRIPTAN SUCCINATE 50 MG PO TABS: ORAL_TABLET | ORAL | 0 refills | Status: AC

## 2023-07-02 NOTE — Discharge Instructions (Addendum)
Continue to monitor your abdominal pain and go to the emergency department if severely worsening at any time.  Your urinalysis is reassuring today as is your exam and vital signs with regard to this concern.  Regarding your headache and right temple pain, I am most suspicious for a migraine, possibly with some underlying seasonal allergy component so taking Zyrtec, Flonase and the medications that I have prescribed should hopefully help take care of this.  As we discussed, your symptoms are slightly concerning for a more emergent condition called temporal arteritis which can lead to issues such as vision loss and other long-term complications if not addressed.  I recommend seeing an ophthalmologist as soon as possible.  I will place an urgent referral today but as discussed I recommend reaching out today to see if anyone can see you within the next few days to a week.  I have placed 1 option for ophthalmology and your follow-up instructions, however you may call which ever specialist you prefer for this appointment.  I have already started you on the initial treatment for this which is high-dose prednisone just to cover this base.  This should help with a migraine as well.  If you begin to have vision loss or worsening symptoms at any time, go to the emergency department.

## 2023-07-02 NOTE — ED Triage Notes (Signed)
Pt reports left side abdominal pain, right side of face was "swollen",  and having "migraines" 1.5 weeks.
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
## 2023-07-10 ENCOUNTER — Ambulatory Visit
Admission: EM | Admit: 2023-07-10 | Discharge: 2023-07-10 | Disposition: A | Discharge status: Home / Self Care | Payer: Self-pay | Attending: Nurse Practitioner | Admitting: Nurse Practitioner

## 2023-07-10 DIAGNOSIS — Z8669 Personal history of other diseases of the nervous system and sense organs: Secondary | ICD-10-CM

## 2023-07-10 DIAGNOSIS — G43811 Other migraine, intractable, with status migrainosus: Secondary | ICD-10-CM

## 2023-07-10 MED ORDER — ONDANSETRON 4 MG PO TBDP
4.0000 mg | ORAL_TABLET | Freq: Once | ORAL | Status: AC
  Administered 2023-07-10: 4 mg via ORAL

## 2023-07-10 MED ORDER — SUMATRIPTAN SUCCINATE 6 MG/0.5ML ~~LOC~~ SOLN
6.0000 mg | Freq: Once | SUBCUTANEOUS | Status: AC
  Administered 2023-07-10: 6 mg via SUBCUTANEOUS

## 2023-07-10 MED ORDER — KETOROLAC TROMETHAMINE 30 MG/ML IJ SOLN
30.0000 mg | Freq: Once | INTRAMUSCULAR | Status: AC
  Administered 2023-07-10: 30 mg via INTRAMUSCULAR

## 2023-07-10 MED ORDER — DEXAMETHASONE SODIUM PHOSPHATE 10 MG/ML IJ SOLN
10.0000 mg | Freq: Once | INTRAMUSCULAR | Status: AC
  Administered 2023-07-10: 10 mg via INTRAMUSCULAR
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
## 2023-07-10 NOTE — Discharge Instructions (Signed)
As discussed, because your headache continues to persist with temporary relief with medication, it is recommended that you go to the emergency department for further evaluation.  You have been administered Decadron 10 mg, Toradol 30 mg, and sumatriptan 6 mg to help with your headache pain. If symptoms do not improve or resolve with this medication, please go to the emergency department immediately. Attend scheduled PCP appointment to establish care. Follow-up as needed.

## 2023-07-10 NOTE — ED Triage Notes (Addendum)
Pt c/o migraine headache,pain is on the right side of the head from the eye to behind his ear.  x 2 weeks has been intermittent, last night it was consistent. Pt was seen here recently and was given a preventative medication, helps when he's been taking it when it wears off the headache comes back was told to see an eye doctor the eye doctor tom him he needed a Primary care, that he didn't need his eye's checked.

## 2023-07-11 ENCOUNTER — Encounter (HOSPITAL_COMMUNITY): Payer: Self-pay | Admitting: *Deleted

## 2023-07-11 ENCOUNTER — Other Ambulatory Visit: Payer: Self-pay

## 2023-07-11 ENCOUNTER — Emergency Department (HOSPITAL_COMMUNITY)
Admission: EM | Admit: 2023-07-11 | Discharge: 2023-07-11 | Disposition: A | Discharge status: Home / Self Care | Payer: Self-pay | Attending: Emergency Medicine | Admitting: Emergency Medicine

## 2023-07-11 DIAGNOSIS — R519 Headache, unspecified: Secondary | ICD-10-CM | POA: Insufficient documentation

## 2023-07-11 DIAGNOSIS — R110 Nausea: Secondary | ICD-10-CM | POA: Insufficient documentation

## 2023-07-11 MED ORDER — IBUPROFEN 800 MG PO TABS: 800.0000 mg | ORAL_TABLET | Freq: Three times a day (TID) | ORAL | 0 refills | Status: AC | PRN

## 2023-07-11 MED ORDER — DIPHENHYDRAMINE HCL 50 MG/ML IJ SOLN
25.0000 mg | Freq: Once | INTRAMUSCULAR | Status: AC
  Administered 2023-07-11: 25 mg via INTRAVENOUS
  Filled 2023-07-11: qty 1

## 2023-07-11 MED ORDER — KETOROLAC TROMETHAMINE 30 MG/ML IJ SOLN
30.0000 mg | Freq: Once | INTRAMUSCULAR | Status: AC
  Administered 2023-07-11: 30 mg via INTRAVENOUS
  Filled 2023-07-11: qty 1

## 2023-07-11 MED ORDER — METOCLOPRAMIDE HCL 5 MG/ML IJ SOLN
10.0000 mg | Freq: Once | INTRAMUSCULAR | Status: AC
  Administered 2023-07-11: 10 mg via INTRAVENOUS
  Filled 2023-07-11: qty 2

## 2023-07-11 NOTE — Discharge Instructions (Addendum)
Follow-up with your family doctor if any more problems.  Also you can follow-up with [facility] neurology for headaches

## 2023-07-11 NOTE — ED Triage Notes (Signed)
Pt sent from UC for HA that's ongoing for past 2 weeks. UC concerned for possible temporal arteritis.

## 2023-07-11 NOTE — ED Notes (Signed)
Went to discharge patient and family and patient are requesting further testing.  Explained to patient and family if wisdom tooth was just removed the headaches could still be from it since this was on Friday. Asked provider to see patient prior to discharge

## 2023-07-11 NOTE — ED Provider Notes (Signed)
  [place] [facility] AT [facility] Provider Note   CSN: [id number] Arrival date & time: 07/11/23  1659     History {Add pertinent medical, surgical, social history, OB history to HPI:1} Chief Complaint  Patient presents with   Headache    Johnathan Bautista is a 37 y.o. male.  Patient with a history of migraine headaches.  Patient also complains of nausea   Headache      Home Medications Prior to Admission medications   Medication Sig Start Date End Date Taking? Authorizing Provider  amoxicillin-clavulanate (AUGMENTIN) 875-125 MG tablet Take 1 tablet by mouth 2 (two) times daily. 05/13/23   Waldon Merl, PA-C  ipratropium (ATROVENT) 0.03 % nasal spray Place 2 sprays into both nostrils every 12 (twelve) hours. 05/13/23   Waldon Merl, PA-C  predniSONE (DELTASONE) 20 MG tablet Take 3 tablets (60 mg total) by mouth daily with breakfast. 07/02/23   Particia Nearing, PA-C  SUMAtriptan (IMITREX) 50 MG tablet Take 1 tablet at first sign of migraine. May repeat in 2 hours if headache persists or recurs. Max of 2 tabs daily 07/02/23   Particia Nearing, PA-C      Allergies    Fish allergy    Review of Systems   Review of Systems  Neurological:  Positive for headaches.    Physical Exam Updated Vital Signs BP 132/84 (BP Location: Right Arm)   Pulse 84   Resp 16   Ht 5\' 6"  (1.676 m)   Wt 88.5 kg   SpO2 96%   BMI 31.47 kg/m  Physical Exam  ED Results / Procedures / Treatments   Labs (all labs ordered are listed, but only abnormal results are displayed) Labs Reviewed - No data to display  EKG None  Radiology No results found.  Procedures Procedures  {Document cardiac monitor, telemetry assessment procedure when appropriate:1}  Medications Ordered in ED Medications  ketorolac (TORADOL) 30 MG/ML injection 30 mg (30 mg Intravenous Given 07/11/23 1824)  metoCLOPramide (REGLAN) injection 10 mg (10 mg Intravenous Given 07/11/23 1824)   diphenhydrAMINE (BENADRYL) injection 25 mg (25 mg Intravenous Given 07/11/23 1824)    ED Course/ Medical Decision Making/ A&P   {   Click here for ABCD2, HEART and other calculatorsREFRESH Note before signing :1}                              Medical Decision Making Risk Prescription drug management.   Patient with migraine headache that improved with migraine cocktail.  He will follow-up with his PCP  {Document critical care time when appropriate:1} {Document review of labs and clinical decision tools ie heart score, Chads2Vasc2 etc:1}  {Document your independent review of radiology images, and any outside records:1} {Document your discussion with family members, caretakers, and with consultants:1} {Document social determinants of health affecting pt's care:1} {Document your decision making why or why not admission, treatments were needed:1} Final Clinical Impression(s) / ED Diagnoses Final diagnoses:  Bad headache    Rx / DC Orders ED Discharge Orders     None

## 2023-07-20 ENCOUNTER — Ambulatory Visit (INDEPENDENT_AMBULATORY_CARE_PROVIDER_SITE_OTHER): Payer: Self-pay | Admitting: Neurology

## 2023-07-20 ENCOUNTER — Encounter: Payer: Self-pay | Admitting: Neurology

## 2023-07-20 VITALS — BP 120/81 | HR 69 | Ht 68.0 in | Wt 206.0 lb

## 2023-07-20 DIAGNOSIS — R519 Headache, unspecified: Secondary | ICD-10-CM

## 2023-07-20 DIAGNOSIS — J0101 Acute recurrent maxillary sinusitis: Secondary | ICD-10-CM

## 2023-07-20 DIAGNOSIS — H6690 Otitis media, unspecified, unspecified ear: Secondary | ICD-10-CM

## 2023-07-20 DIAGNOSIS — G43009 Migraine without aura, not intractable, without status migrainosus: Secondary | ICD-10-CM

## 2023-07-20 MED ORDER — NURTEC 75 MG PO TBDP: 75.0000 mg | ORAL_TABLET | Freq: Every day | ORAL | 0 refills | Status: AC | PRN

## 2023-07-20 MED ORDER — AMOXICILLIN-POT CLAVULANATE 875-125 MG PO TABS: 1.0000 | ORAL_TABLET | Freq: Two times a day (BID) | ORAL | 0 refills | Status: DC

## 2023-07-20 NOTE — Patient Instructions (Signed)
Be here at 330 tomorrow Take one nurtec tonight and one in the morning BUN creat
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
## 2023-07-21 ENCOUNTER — Ambulatory Visit (INDEPENDENT_AMBULATORY_CARE_PROVIDER_SITE_OTHER): Payer: Self-pay

## 2023-07-21 DIAGNOSIS — R519 Headache, unspecified: Secondary | ICD-10-CM

## 2023-07-21 MED ORDER — GADOBENATE DIMEGLUMINE 529 MG/ML IV SOLN
20.0000 mL | Freq: Once | INTRAVENOUS | Status: AC | PRN
  Administered 2023-07-21: 20 mL via INTRAVENOUS

## 2023-07-22 ENCOUNTER — Encounter: Payer: Self-pay | Admitting: Neurology

## 2023-07-22 ENCOUNTER — Telehealth: Payer: Self-pay | Admitting: Neurology

## 2023-07-22 NOTE — Telephone Encounter (Signed)
Pt states he was told his MRI was being ordered as urgent and that the request was being made that Radiology gets results to review.  Pt is asking to be called with results as soon as possible

## 2023-07-26 NOTE — Telephone Encounter (Signed)
Pt and Dr Lucia Gaskins also communicated in mychart regarding results.

## 2023-07-26 NOTE — Telephone Encounter (Signed)
Pt scheduled with Megan for 07/29/23 at 8:30am

## 2023-07-26 NOTE — Telephone Encounter (Signed)
Append Comments   Seen   There is some mucoperiosteal thickening of a couple of the ethmoid air cells(sinuses) on the right to be seen with minimal chronic ethmoid sinusitis. So there is some sinus disease but nothing severe. I also saw some fluid behind the right ear as well. Are you any better on the antibiotics? I will send you a mychart. Thanks, Dr. Lucia Gaskins  Written by Anson Fret, MD on 07/22/2023  5:14 PM EDT Seen by patient Johnathan Bautista on 07/24/2023  9:55 PM

## 2023-07-29 ENCOUNTER — Ambulatory Visit: Payer: Self-pay | Admitting: Adult Health

## 2023-07-29 ENCOUNTER — Encounter: Payer: Self-pay | Admitting: Adult Health

## 2023-08-02 ENCOUNTER — Telehealth: Payer: Self-pay

## 2023-08-02 ENCOUNTER — Ambulatory Visit: Payer: Self-pay | Admitting: Nurse Practitioner

## 2023-08-02 NOTE — Telephone Encounter (Signed)
NS no reason has been blocked.

## 2023-08-02 NOTE — Telephone Encounter (Signed)
Dismissed from [employer]

## 2023-08-30 ENCOUNTER — Other Ambulatory Visit: Payer: Self-pay

## 2023-08-30 ENCOUNTER — Emergency Department (HOSPITAL_COMMUNITY): Payer: Self-pay

## 2023-08-30 ENCOUNTER — Encounter (HOSPITAL_COMMUNITY): Payer: Self-pay | Admitting: Emergency Medicine

## 2023-08-30 ENCOUNTER — Emergency Department (HOSPITAL_COMMUNITY)
Admission: EM | Admit: 2023-08-30 | Discharge: 2023-08-30 | Disposition: A | Discharge status: Home / Self Care | Payer: Self-pay | Attending: Emergency Medicine | Admitting: Emergency Medicine

## 2023-08-30 DIAGNOSIS — M7989 Other specified soft tissue disorders: Secondary | ICD-10-CM | POA: Insufficient documentation

## 2023-08-30 DIAGNOSIS — W132XXA Fall from, out of or through roof, initial encounter: Secondary | ICD-10-CM | POA: Insufficient documentation

## 2023-08-30 DIAGNOSIS — S52612A Displaced fracture of left ulna styloid process, initial encounter for closed fracture: Secondary | ICD-10-CM | POA: Insufficient documentation

## 2023-08-30 DIAGNOSIS — S52502A Unspecified fracture of the lower end of left radius, initial encounter for closed fracture: Secondary | ICD-10-CM | POA: Insufficient documentation

## 2023-08-30 MED ORDER — IBUPROFEN 400 MG PO TABS
600.0000 mg | ORAL_TABLET | Freq: Once | ORAL | Status: AC
  Administered 2023-08-30: 600 mg via ORAL
  Filled 2023-08-30: qty 2

## 2023-08-30 MED ORDER — FENTANYL CITRATE (PF) 100 MCG/2ML IJ SOLN
100.0000 ug | Freq: Once | INTRAMUSCULAR | Status: AC
  Administered 2023-08-30: 100 ug via INTRAVENOUS
  Filled 2023-08-30: qty 2

## 2023-08-30 MED ORDER — HYDROMORPHONE HCL 1 MG/ML IJ SOLN
1.0000 mg | Freq: Once | INTRAMUSCULAR | Status: AC
  Administered 2023-08-30: 1 mg via INTRAVENOUS
  Filled 2023-08-30: qty 1

## 2023-08-30 MED ORDER — OXYCODONE HCL 5 MG PO TABS: 5.0000 mg | ORAL_TABLET | ORAL | 0 refills | Status: DC | PRN

## 2023-08-30 MED ORDER — LIDOCAINE HCL (PF) 2 % IJ SOLN: 10.0000 mL | Freq: Once | INTRAMUSCULAR | Status: AC

## 2023-08-30 MED ORDER — OXYCODONE-ACETAMINOPHEN 5-325 MG PO TABS: 1.0000 | ORAL_TABLET | Freq: Four times a day (QID) | ORAL | 0 refills | Status: DC | PRN

## 2023-08-30 MED ORDER — OXYCODONE HCL 5 MG PO TABS
5.0000 mg | ORAL_TABLET | Freq: Once | ORAL | Status: AC
  Administered 2023-08-30: 5 mg via ORAL
  Filled 2023-08-30: qty 1

## 2023-08-30 MED ORDER — LIDOCAINE HCL (PF) 2 % IJ SOLN
INTRAMUSCULAR | Status: AC
  Administered 2023-08-30: 10 mL
  Filled 2023-08-30: qty 15
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
## 2023-08-30 NOTE — ED Triage Notes (Signed)
Pt presents with left wrist injury, swollen and noticeable deformed, per pt he slipped in fall on wrist.

## 2023-08-30 NOTE — ED Provider Notes (Signed)
Reduction of fracture  Date/Time: 08/30/2023 7:07 PM  Performed by: Pricilla Loveless, MD Authorized by: Pricilla Loveless, MD  Consent: Verbal consent obtained. Risks and benefits: risks, benefits and alternatives were discussed Consent given by: patient Preparation: Patient was prepped and draped in the usual sterile fashion. Local anesthesia used: yes Anesthesia: hematoma block  Anesthesia: Local anesthesia used: yes Local Anesthetic: lidocaine 2% without epinephrine Anesthetic total: 10 mL  Sedation: Patient sedated: no  Patient tolerance: patient tolerated the procedure well with no immediate complications Comments: Finger traps used to help reduce       Pricilla Loveless, MD 08/30/23 1907

## 2023-08-30 NOTE — ED Notes (Signed)
Pt states he may want to cancel head CT. He requests talking to EDP.

## 2023-08-30 NOTE — ED Notes (Signed)
Pt reported to EDP at his splint felt tights and was reporting numbness in fingers. Splint unwrapped by this RN and EDP to assess splint before rewrapping.

## 2023-08-30 NOTE — Discharge Instructions (Addendum)
Be sure to aggressively elevate your left arm to help reduce swelling.  Take ibuprofen and/or Tylenol to help with pain and we are also prescribing you a stronger pain medicine, oxycodone, to help with breakthrough pain.  Dr. Cathrine Muster office will call you tomorrow to help set up outpatient follow-up.  If you develop new or worsening or uncontrolled pain, weakness or numbness in your fingers, or any other new/concerning symptoms then return to the ER or call 911.

## 2023-08-31 ENCOUNTER — Other Ambulatory Visit: Payer: Self-pay

## 2023-08-31 DIAGNOSIS — S52592A Other fractures of lower end of left radius, initial encounter for closed fracture: Secondary | ICD-10-CM

## 2023-08-31 MED FILL — Oxycodone w/ Acetaminophen Tab 5-325 MG: ORAL | Qty: 6 | Status: AC

## 2023-09-01 ENCOUNTER — Other Ambulatory Visit: Payer: Self-pay

## 2023-09-01 ENCOUNTER — Ambulatory Visit: Payer: Self-pay | Admitting: Orthopedic Surgery

## 2023-09-01 ENCOUNTER — Encounter (HOSPITAL_BASED_OUTPATIENT_CLINIC_OR_DEPARTMENT_OTHER): Payer: Self-pay | Admitting: Orthopedic Surgery

## 2023-09-01 DIAGNOSIS — S52592A Other fractures of lower end of left radius, initial encounter for closed fracture: Secondary | ICD-10-CM
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
## 2023-09-03 ENCOUNTER — Ambulatory Visit (HOSPITAL_BASED_OUTPATIENT_CLINIC_OR_DEPARTMENT_OTHER): Payer: Self-pay

## 2023-09-03 ENCOUNTER — Encounter (HOSPITAL_BASED_OUTPATIENT_CLINIC_OR_DEPARTMENT_OTHER): Payer: Self-pay | Admitting: Orthopedic Surgery

## 2023-09-03 ENCOUNTER — Ambulatory Visit (HOSPITAL_BASED_OUTPATIENT_CLINIC_OR_DEPARTMENT_OTHER): Payer: Self-pay | Admitting: Anesthesiology

## 2023-09-03 ENCOUNTER — Encounter (HOSPITAL_BASED_OUTPATIENT_CLINIC_OR_DEPARTMENT_OTHER)
Admission: RE | Disposition: A | Discharge status: Home / Self Care | Payer: Self-pay | Source: Home / Self Care | Attending: Orthopedic Surgery

## 2023-09-03 ENCOUNTER — Other Ambulatory Visit: Payer: Self-pay

## 2023-09-03 ENCOUNTER — Ambulatory Visit (HOSPITAL_BASED_OUTPATIENT_CLINIC_OR_DEPARTMENT_OTHER)
Admission: RE | Admit: 2023-09-03 | Discharge: 2023-09-03 | Disposition: A | Discharge status: Home / Self Care | Payer: Self-pay | Attending: Orthopedic Surgery | Admitting: Orthopedic Surgery

## 2023-09-03 DIAGNOSIS — S52612A Displaced fracture of left ulna styloid process, initial encounter for closed fracture: Secondary | ICD-10-CM

## 2023-09-03 DIAGNOSIS — S52572A Other intraarticular fracture of lower end of left radius, initial encounter for closed fracture: Secondary | ICD-10-CM | POA: Insufficient documentation

## 2023-09-03 DIAGNOSIS — W132XXA Fall from, out of or through roof, initial encounter: Secondary | ICD-10-CM | POA: Insufficient documentation

## 2023-09-03 DIAGNOSIS — S52502A Unspecified fracture of the lower end of left radius, initial encounter for closed fracture: Secondary | ICD-10-CM

## 2023-09-03 DIAGNOSIS — E65 Localized adiposity: Secondary | ICD-10-CM

## 2023-09-03 DIAGNOSIS — Z01818 Encounter for other preprocedural examination: Secondary | ICD-10-CM

## 2023-09-03 DIAGNOSIS — Z87891 Personal history of nicotine dependence: Secondary | ICD-10-CM | POA: Insufficient documentation

## 2023-09-03 HISTORY — PX: OPEN REDUCTION INTERNAL FIXATION (ORIF) DISTAL RADIAL FRACTURE: SHX5989

## 2023-09-03 SURGERY — OPEN REDUCTION INTERNAL FIXATION (ORIF) DISTAL RADIUS FRACTURE
Anesthesia: Monitor Anesthesia Care | Site: Wrist | Laterality: Left

## 2023-09-03 MED ORDER — OXYCODONE HCL 5 MG/5ML PO SOLN: 5.0000 mg | Freq: Once | ORAL | Status: DC | PRN

## 2023-09-03 MED ORDER — 0.9 % SODIUM CHLORIDE (POUR BTL) OPTIME
TOPICAL | Status: DC | PRN
Start: 2023-09-03 — End: 2023-09-03
  Administered 2023-09-03: 1000 mL

## 2023-09-03 MED ORDER — OXYCODONE HCL 5 MG PO TABS: 5.0000 mg | ORAL_TABLET | Freq: Once | ORAL | Status: DC | PRN

## 2023-09-03 MED ORDER — MIDAZOLAM HCL 2 MG/2ML IJ SOLN
INTRAMUSCULAR | Status: AC
  Filled 2023-09-03: qty 2

## 2023-09-03 MED ORDER — FENTANYL CITRATE (PF) 100 MCG/2ML IJ SOLN
INTRAMUSCULAR | Status: AC
  Filled 2023-09-03: qty 2

## 2023-09-03 MED ORDER — CEFAZOLIN SODIUM-DEXTROSE 2-3 GM-%(50ML) IV SOLR
INTRAVENOUS | Status: DC | PRN
  Administered 2023-09-03: 2 g via INTRAVENOUS

## 2023-09-03 MED ORDER — PROPOFOL 500 MG/50ML IV EMUL
INTRAVENOUS | Status: DC | PRN
  Administered 2023-09-03: 100 ug/kg/min via INTRAVENOUS

## 2023-09-03 MED ORDER — PROPOFOL 10 MG/ML IV BOLUS
INTRAVENOUS | Status: DC | PRN
Start: 2023-09-03 — End: 2023-09-03
  Administered 2023-09-03: 40 mg via INTRAVENOUS

## 2023-09-03 MED ORDER — ROPIVACAINE HCL 5 MG/ML IJ SOLN
INTRAMUSCULAR | Status: DC | PRN
Start: 2023-09-03 — End: 2023-09-03
  Administered 2023-09-03: 30 mL via PERINEURAL

## 2023-09-03 MED ORDER — FENTANYL CITRATE (PF) 100 MCG/2ML IJ SOLN: 25.0000 ug | INTRAMUSCULAR | Status: DC | PRN

## 2023-09-03 MED ORDER — CEFAZOLIN SODIUM-DEXTROSE 2-4 GM/100ML-% IV SOLN
INTRAVENOUS | Status: AC
  Filled 2023-09-03: qty 100

## 2023-09-03 MED ORDER — MIDAZOLAM HCL 2 MG/2ML IJ SOLN
2.0000 mg | Freq: Once | INTRAMUSCULAR | Status: AC
  Administered 2023-09-03: 2 mg via INTRAVENOUS

## 2023-09-03 MED ORDER — FENTANYL CITRATE (PF) 100 MCG/2ML IJ SOLN
100.0000 ug | Freq: Once | INTRAMUSCULAR | Status: AC
  Administered 2023-09-03: 100 ug via INTRAVENOUS

## 2023-09-03 MED ORDER — LACTATED RINGERS IV SOLN: INTRAVENOUS | Status: DC

## 2023-09-03 MED ORDER — CEFAZOLIN SODIUM-DEXTROSE 2-4 GM/100ML-% IV SOLN: 2.0000 g | INTRAVENOUS | Status: DC

## 2023-09-03 MED ORDER — OXYCODONE HCL 5 MG PO TABS
5.0000 mg | ORAL_TABLET | ORAL | 0 refills | Status: DC | PRN
Start: 2023-09-03 — End: 2023-09-10

## 2023-09-03 MED ORDER — ONDANSETRON HCL 4 MG/2ML IJ SOLN
INTRAMUSCULAR | Status: DC | PRN
  Administered 2023-09-03: 4 mg via INTRAVENOUS

## 2023-09-03 MED ORDER — KETOROLAC TROMETHAMINE 30 MG/ML IJ SOLN: 30.0000 mg | Freq: Once | INTRAMUSCULAR | Status: DC | PRN

## 2023-09-03 MED ORDER — ONDANSETRON HCL 4 MG/2ML IJ SOLN: 4.0000 mg | Freq: Once | INTRAMUSCULAR | Status: DC | PRN

## 2023-09-03 MED ORDER — FENTANYL CITRATE (PF) 250 MCG/5ML IJ SOLN
INTRAMUSCULAR | Status: DC | PRN
  Administered 2023-09-03: 50 ug via INTRAVENOUS

## 2023-09-03 SURGICAL SUPPLY — 65 items
APL PRP STRL LF DISP 70% ISPRP (MISCELLANEOUS) ×1
BIT DRILL SOLID 2.0X40MM (BIT) IMPLANT
BIT DRILL SOLID 2.5X40MM (BIT) IMPLANT
BLADE SURG 15 STRL LF DISP TIS (BLADE) ×4 IMPLANT
BLADE SURG 15 STRL SS (BLADE) ×2
BNDG CMPR 5X4 CHSV STRCH STRL (GAUZE/BANDAGES/DRESSINGS) ×1
BNDG CMPR 5X4 KNIT ELC UNQ LF (GAUZE/BANDAGES/DRESSINGS) ×2
BNDG CMPR 75X21 PLY HI ABS (MISCELLANEOUS) ×1
BNDG CMPR 9X4 STRL LF SNTH (GAUZE/BANDAGES/DRESSINGS) ×1
BNDG COHESIVE 4X5 TAN STRL LF (GAUZE/BANDAGES/DRESSINGS) ×2 IMPLANT
BNDG ELASTIC 4INX 5YD STR LF (GAUZE/BANDAGES/DRESSINGS) ×4 IMPLANT
BNDG ESMARK 4X9 LF (GAUZE/BANDAGES/DRESSINGS) ×2 IMPLANT
CHLORAPREP W/TINT 26 (MISCELLANEOUS) ×2 IMPLANT
CORD BIPOLAR FORCEPS 12FT (ELECTRODE) ×2 IMPLANT
COVER BACK TABLE 60X90IN (DRAPES) ×2 IMPLANT
CUFF TOURN SGL QUICK 18X4 (TOURNIQUET CUFF) ×2 IMPLANT
CUFF TOURN SGL QUICK 24 (TOURNIQUET CUFF)
CUFF TRNQT CYL 24X4X16.5-23 (TOURNIQUET CUFF) IMPLANT
DRAPE EXTREMITY T 121X128X90 (DISPOSABLE) ×2 IMPLANT
DRAPE OEC MINIVIEW 54X84 (DRAPES) ×2 IMPLANT
DRAPE SURG 17X23 STRL (DRAPES) ×2 IMPLANT
DRILL SOLID 2.0X40MM (BIT) ×1
DRILL SOLID 2.5X40MM (BIT) ×1
GAUZE PAD ABD 8X10 STRL (GAUZE/BANDAGES/DRESSINGS) ×2 IMPLANT
GAUZE SPONGE 4X4 12PLY STRL (GAUZE/BANDAGES/DRESSINGS) ×2 IMPLANT
GAUZE STRETCH 2X75IN STRL (MISCELLANEOUS) ×2 IMPLANT
GAUZE XEROFORM 1X8 LF (GAUZE/BANDAGES/DRESSINGS) ×2 IMPLANT
GLOVE BIO SURGEON STRL SZ7.5 (GLOVE) ×4 IMPLANT
GLOVE BIOGEL PI IND STRL 7.5 (GLOVE) ×4 IMPLANT
GOWN STRL REUS W/ TWL LRG LVL3 (GOWN DISPOSABLE) ×4 IMPLANT
GOWN STRL REUS W/TWL LRG LVL3 (GOWN DISPOSABLE) ×2
GOWN STRL REUS W/TWL XL LVL3 (GOWN DISPOSABLE) ×2 IMPLANT
GUIDE AIMING 1.5MM (WIRE) IMPLANT
MANIFOLD NEPTUNE II (INSTRUMENTS) ×2 IMPLANT
NDL HYPO 25X1 1.5 SAFETY (NEEDLE) IMPLANT
NEEDLE HYPO 25X1 1.5 SAFETY (NEEDLE)
NS IRRIG 1000ML POUR BTL (IV SOLUTION) IMPLANT
PACK BASIN DAY SURGERY FS (CUSTOM PROCEDURE TRAY) ×2 IMPLANT
PAD CAST 4YDX4 CTTN HI CHSV (CAST SUPPLIES) ×4 IMPLANT
PADDING CAST COTTON 4X4 STRL (CAST SUPPLIES) ×2
PEG GEMINUS THRD LOCK 2.3X22 (Peg) IMPLANT
PEG GEMINUS THRD LOCK 2.3X23 (Peg) IMPLANT
PEG GEMINUS THRD LOCK 2.3X24 (Peg) IMPLANT
PEG GEMINUS THRD TPNL 2.7X28 (Peg) IMPLANT
PEG LOCK SMTH 2X24 (Peg) IMPLANT
PLATE GEMINUS DIST RAD LTW 4HL (Plate) IMPLANT
SCREW GEMINUS CORT LOCK 3.5X13 (Screw) IMPLANT
SCREW GEMINUS PANL 3.5X13 (Screw) IMPLANT
SCREW POLY NON LOCK 3.5MMX12MM (Screw) IMPLANT
SCREWDRIVER SURG ST 2 (INSTRUMENTS) IMPLANT
SHEET MEDIUM DRAPE 40X70 STRL (DRAPES) ×2 IMPLANT
SLEEVE SCD COMPRESS KNEE MED (STOCKING) IMPLANT
SPIKE FLUID TRANSFER (MISCELLANEOUS) IMPLANT
SPLINT PLASTER CAST XFAST 4X15 (CAST SUPPLIES) ×20 IMPLANT
STOCKINETTE IMPERVIOUS LG (DRAPES) ×2 IMPLANT
SUCTION TUBE FRAZIER 10FR DISP (SUCTIONS) ×2 IMPLANT
SUT ETHILON 4 0 PS 2 18 (SUTURE) ×2 IMPLANT
SUT MNCRL AB 3-0 PS2 27 (SUTURE) ×2 IMPLANT
SUT VIC AB 4-0 PS2 18 (SUTURE) IMPLANT
SYR BULB EAR ULCER 3OZ GRN STR (SYRINGE) ×4 IMPLANT
SYR CONTROL 10ML LL (SYRINGE) IMPLANT
TOWEL GREEN STERILE FF (TOWEL DISPOSABLE) ×4 IMPLANT
TUBE CONNECTING 20X1/4 (TUBING) ×2 IMPLANT
WIRE FIX 1.5 STANDARD TIP (WIRE) ×2
WIRE FIX 1.5 STD TIP (WIRE) IMPLANT

## 2023-09-03 NOTE — Anesthesia Procedure Notes (Signed)
Anesthesia Regional Block: Supraclavicular block   Pre-Anesthetic Checklist: , timeout performed,  Correct Patient, Correct Site, Correct Laterality,  Correct Procedure, Correct Position, site marked,  Risks and benefits discussed,  Surgical consent,  Pre-op evaluation,  At surgeon's request and post-op pain management  Laterality: Left  Prep: chloraprep       Needles:  Injection technique: Single-shot  Needle Type: Echogenic Needle     Needle Length: 9cm      Additional Needles:   Procedures:,,,, ultrasound used (permanent image in chart),,    Narrative:  Start time: 09/03/2023 12:00 PM End time: 09/03/2023 12:10 PM Injection made incrementally with aspirations every 5 mL.  Performed by: Personally  Anesthesiologist: Eilene Ghazi, MD  Additional Notes: Patient tolerated the procedure well without complications

## 2023-09-03 NOTE — Progress Notes (Signed)
Assisted Dr. Kalman Shan with left, supraclavicular, ultrasound guided block. Side rails up, monitors on throughout procedure. See vital signs in flow sheet. Tolerated Procedure well. ?

## 2023-09-03 NOTE — Transfer of Care (Signed)
Immediate Anesthesia Transfer of Care Note  Patient: Johnathan Bautista  Procedure(s) Performed: LEFT WRIST OPEN REDUCTION INTERNAL FIXATION (ORIF) DISTAL RADIUS FRACTURE (Left: Wrist)  Patient Location: PACU  Anesthesia Type:MAC combined with regional for post-op pain  Level of Consciousness: awake, drowsy, and patient cooperative  Airway & Oxygen Therapy: Patient Spontanous Breathing and Patient connected to face mask oxygen  Post-op Assessment: Report given to RN and Post -op Vital signs reviewed and stable  Post vital signs: Reviewed and stable  Last Vitals:  Vitals Value Taken Time  BP    Temp    Pulse    Resp    SpO2      Last Pain:  Vitals:   09/03/23 1105  TempSrc: Oral  PainSc: 5       Patients Stated Pain Goal: 7 (09/03/23 1105)  Complications: No notable events documented.

## 2023-09-03 NOTE — Anesthesia Procedure Notes (Signed)
Anesthesia Procedure Image    

## 2023-09-03 NOTE — Discharge Instructions (Addendum)
Hand Surgery Postop Instructions   Dressings: Maintain postoperative dressing until orthopedic follow-up.  Keep operative site clean and dry until orthopedic follow-up.  Wound Care: Keep your hand elevated above the level of your heart.  Do not allow it to dangle by your side. Moving your fingers is advised to stimulate circulation but will depend on the site of your surgery.  If you have a splint applied, your doctor will advise you regarding movement.  Activity: Do not drive or operate machinery until clearance given from physician. No heavy lifting with operative extremity.  Diet:  Drink liquids today or eat a light diet.  You may resume a regular diet tomorrow.    General expectations: Take prescribed medication if given, transition to over-the-counter medication as quickly as possible. Fingers may become slightly swollen.  Call your doctor if any of the following occur: Severe pain not relieved by pain medication. Elevated temperature. Dressing soaked with blood. Inability to move fingers. White or bluish color to fingers.   Post Anesthesia Home Care Instructions  Activity: Get plenty of rest for the remainder of the day. A responsible individual must stay with you for 24 hours following the procedure.  For the next 24 hours, DO NOT: -Drive a car -Advertising copywriter -Drink alcoholic beverages -Take any medication unless instructed by your physician -Make any legal decisions or sign important papers.  Meals: Start with liquid foods such as gelatin or soup. Progress to regular foods as tolerated. Avoid greasy, spicy, heavy foods. If nausea and/or vomiting occur, drink only clear liquids until the nausea and/or vomiting subsides. Call your physician if vomiting continues.  Special Instructions/Symptoms: Your throat may feel dry or sore from the anesthesia or the breathing tube placed in your throat during surgery. If this causes discomfort, gargle with warm salt water.  The discomfort should disappear within 24 hours.  If you had a scopolamine patch placed behind your ear for the management of post- operative nausea and/or vomiting:  1. The medication in the patch is effective for 72 hours, after which it should be removed.  Wrap patch in a tissue and discard in the trash. Wash hands thoroughly with soap and water. 2. You may remove the patch earlier than 72 hours if you experience unpleasant side effects which may include dry mouth, dizziness or visual disturbances. 3. Avoid touching the patch. Wash your hands with soap and water after contact with the patch.    Regional Anesthesia Blocks  1. You may not be able to move or feel the "blocked" extremity after a regional anesthetic block. This may last may last from 3-48 hours after placement, but it will go away. The length of time depends on the medication injected and your individual response to the medication. As the nerves start to wake up, you may experience tingling as the movement and feeling returns to your extremity. If the numbness and inability to move your extremity has not gone away after 48 hours, please call your surgeon.   2. The extremity that is blocked will need to be protected until the numbness is gone and the strength has returned. Because you cannot feel it, you will need to take extra care to avoid injury. Because it may be weak, you may have difficulty moving it or using it. You may not know what position it is in without looking at it while the block is in effect.  3. For blocks in the legs and feet, returning to weight bearing and walking needs to  be done carefully. You will need to wait until the numbness is entirely gone and the strength has returned. You should be able to move your leg and foot normally before you try and bear weight or walk. You will need someone to be with you when you first try to ensure you do not fall and possibly risk injury.  4. Bruising and tenderness at the  needle site are common side effects and will resolve in a few days.  5. Persistent numbness or new problems with movement should be communicated to the surgeon or the [facility] [phone] [facility] [phone]).

## 2023-09-03 NOTE — Anesthesia Postprocedure Evaluation (Signed)
Anesthesia Post Note  Patient: Johnathan Bautista  Procedure(s) Performed: LEFT WRIST OPEN REDUCTION INTERNAL FIXATION (ORIF) DISTAL RADIUS FRACTURE (Left: Wrist)     Patient location during evaluation: PACU Anesthesia Type: MAC Level of consciousness: awake and alert Pain management: pain level controlled Vital Signs Assessment: post-procedure vital signs reviewed and stable Respiratory status: spontaneous breathing, nonlabored ventilation, respiratory function stable and patient connected to nasal cannula oxygen Cardiovascular status: stable and blood pressure returned to baseline Postop Assessment: no apparent nausea or vomiting Anesthetic complications: no  No notable events documented.  Last Vitals:  Vitals:   09/03/23 1545 09/03/23 1614  BP: 112/75 116/86  Pulse: 61 65  Resp: 16 16  Temp:  (!) 36.1 C  SpO2: 95% 98%    Last Pain:  Vitals:   09/03/23 1614  TempSrc: Temporal  PainSc:                  Clydine Parkison S

## 2023-09-03 NOTE — Op Note (Addendum)
NAME: Johnathan Bautista MEDICAL RECORD NO: [id number] DATE OF BIRTH: June 26, 1986 FACILITY: [facility] LOCATION: [facility] PHYSICIAN: Samuella Cota, MD   OPERATIVE REPORT   DATE OF PROCEDURE: 09/03/23    PREOPERATIVE DIAGNOSIS: Left distal radius fracture, intra-articular with 3+ fragments   POSTOPERATIVE DIAGNOSIS: Left distal radius fracture, intra-articular 3+ fragments   PROCEDURE: Left distal radius fracture open reduction internal fixation, intra-articular 3+ fragments   SURGEON:  Samuella Cota, M.D.   ASSISTANT: None   ANESTHESIA:  Regional with sedation   INTRAVENOUS FLUIDS:  Per anesthesia flow sheet.   ESTIMATED BLOOD LOSS:  Minimal.   COMPLICATIONS:  None.   SPECIMENS:  none   TOURNIQUET TIME:    Total Tourniquet Time Documented: Upper Arm (Left) - 66 minutes Total: Upper Arm (Left) - 66 minutes    DISPOSITION:  Stable to PACU.   INDICATIONS: This is a 37 year old male who sustained a left distal radius fracture with associated ulnar styloid fracture after a fall from a height onto the outstretched left wrist.  He sustained a severely comminuted, displaced, angulated, shortened, intra-articular distal radius fracture.  We discussed treatment options range from conservative to surgical.  From a conservative standpoint, with nonoperative treatment, we discussed the likelihood of poor healing, dysfunction of the wrist, weakness long-term.  In order to restore anatomy, promote healing, mobilization, and potentially prevent posttraumatic arthritis, he is indicated for left distal radius fracture open reduction internal fixation, closed versus open treatment of ulnar styloid fracture, possible bridge plating.   Risks and benefits of the procedure were discussed, risks including but not limited to infection, bleeding, scarring, stiffness, nerve injury, tendon injury, vascular injury, hardware complication if utilized, recurrence of symptoms and need for  subsequent operation.  Patient expressed understanding.  We also discussed the possibility for dorsal bridge plating which would require a subsequent operation for removal.   OPERATIVE COURSE: Patient was seen and identified in the preoperative area and marked appropriately.  Surgical consent had been signed. Preoperative IV antibiotic prophylaxis was given. He was transferred to the operating room and placed in supine position with the Left upper extremity on an arm board.  Sedation was induced by the anesthesiologist. A regional block had been performed by anesthesia in preoperative holding.    Left upper extremity was prepped and draped in normal sterile orthopedic fashion.  A surgical pause was performed between the surgeons, anesthesia, and operating room staff and all were in agreement as to the patient, procedure, and site of procedure.  Tourniquet was placed and padded appropriately to the left upper arm.  The arm was exsanguinated and tourniquet was inflated to 250 mmHg.  Longitudinal incision was designed over the volar radial aspect of the wrist, standard FCR approach was utilized.  15 blade was utilized for skin, blunt dissection was performed down, all neurovascular structures were protected in their entirety throughout.  Radial artery was identified and protected.  Pronator quadratus was elevated from distal radius in order to expose the underlying fracture site.  There was noted to be significant comminution at the fracture site with multiple intra-articular fragments.  Gentle reduction maneuver was performed of the fragments under biplanar fluoroscopy.  Brachioradialis tenotomy was performed to aid in the reduction.  Once we were satisfied with our reduction, standard 4-hole [employer] plate was selected and temporarily affixed to bone utilizing a combination of clamp and K wires.  Biplanar fluoroscopy was once again utilized to confirm appropriate plate placement and maintenance of  fracture  reduction as well as appropriate reduction of the intra-articular fragments and restoration of the joint line.  Shaft screw was then placed to secure the plate proximally.  Nonlocking screw was utilized distally initially to capture the dorsal comminution and restore volar tilt.  Volar tilt was noted to be restored appropriately, joint line was well reduced.  Remaining distal holes were filled utilizing locking screws taking care to ensure all screws remained extra-articular and beneath the joint line.  Nonlocking screw distally was then swapped for a locking screw of shorter variety to once again ensure screw remained well within bone.  Shaft screws were then subsequently filled with combination of nonlocking x2 and locking screw.  Distal radioulnar joint was then tested and noted to be stable in all planes.  Decision was made to continue with closed treatment of the ulnar styloid fracture.  Final fluoroscopic views were taken utilizing biplanar fluoroscopy which confirmed appropriate plate placement, all fracture fragments have been secured appropriately and joint line had been restored as well.  Gentle flexion and extension of the wrist under fluoroscopy indicated stable appearance of the wrist with appropriately secured fracture fragments.  The tourniquet was deflated at 66 minutes.  Fingertips were pink with brisk capillary refill after deflation of tourniquet.  Copious irrigation was performed.  Layered closure was performed utilizing a combination of 3-0 Monocryl for subcutaneous layer and 4-0 nylon for the skin surface.  Sterile dressings were applied followed by application of a short arm volar slab splint utilizing plaster.  The operative drapes were broken down.  Sling was applied.  The patient was awoken from anesthesia safely and taken to PACU in stable condition.  I will see him back in the office in 2 weeks for postoperative followup.       Samuella Cota, MD Electronically  signed, 09/03/23

## 2023-09-03 NOTE — Interval H&P Note (Signed)
History and Physical Interval Note:  09/03/2023 11:12 AM  Johnathan Bautista  has presented today for surgery, with the diagnosis of LEFT WRIST DISTAL RADIUS FRACTURE.  The various methods of treatment have been discussed with the patient and family. After consideration of risks, benefits and other options for treatment, the patient has consented to  Procedure(s): LEFT WRIST OPEN REDUCTION INTERNAL FIXATION (ORIF) DISTAL RADIUS FRACTURE (Left) as a surgical intervention.  The patient's history has been reviewed, patient examined, no change in status, stable for surgery.  I have reviewed the patient's chart and labs.  Questions were answered to the patient's satisfaction.     Kym Fenter

## 2023-09-03 NOTE — Anesthesia Preprocedure Evaluation (Signed)
Anesthesia Evaluation  Patient identified by MRN, date of birth, ID band Patient awake    Reviewed: Allergy & Precautions, H&P , NPO status , Patient's Chart, lab work & pertinent test results  Airway Mallampati: II  TM Distance: >3 FB Neck ROM: Full    Dental no notable dental hx.    Pulmonary neg pulmonary ROS, former smoker   Pulmonary exam normal breath sounds clear to auscultation       Cardiovascular negative cardio ROS Normal cardiovascular exam Rhythm:Regular Rate:Normal     Neuro/Psych negative neurological ROS  negative psych ROS   GI/Hepatic negative GI ROS, Neg liver ROS,,,  Endo/Other  negative endocrine ROS    Renal/GU negative Renal ROS  negative genitourinary   Musculoskeletal negative musculoskeletal ROS (+)    Abdominal   Peds negative pediatric ROS (+)  Hematology negative hematology ROS (+)   Anesthesia Other Findings   Reproductive/Obstetrics negative OB ROS                             Anesthesia Physical Anesthesia Plan  ASA: 2  Anesthesia Plan: MAC   Post-op Pain Management: Regional block*   Induction: Intravenous  PONV Risk Score and Plan: 1 and Treatment may vary due to age or medical condition, Propofol infusion and Ondansetron  Airway Management Planned: Simple Face Mask  Additional Equipment:   Intra-op Plan:   Post-operative Plan:   Informed Consent: I have reviewed the patients History and Physical, chart, labs and discussed the procedure including the risks, benefits and alternatives for the proposed anesthesia with the patient or authorized representative who has indicated his/her understanding and acceptance.     Dental advisory given  Plan Discussed with: CRNA and Surgeon  Anesthesia Plan Comments:        Anesthesia Quick Evaluation

## 2023-09-07 ENCOUNTER — Encounter (HOSPITAL_BASED_OUTPATIENT_CLINIC_OR_DEPARTMENT_OTHER): Payer: Self-pay | Admitting: Orthopedic Surgery

## 2023-09-08 ENCOUNTER — Telehealth: Payer: Self-pay

## 2023-09-08 NOTE — Telephone Encounter (Signed)
Patient's wife called stating that patient is having severe pain in his left elbow and is not able to move his left arm.  Would like a call back.  Cb# [phone].  Please advise.  Thank you.

## 2023-09-09 ENCOUNTER — Telehealth: Payer: Self-pay | Admitting: Orthopedic Surgery

## 2023-09-09 NOTE — Telephone Encounter (Signed)
Patient called returning a call. CB#[phone]

## 2023-09-10 ENCOUNTER — Ambulatory Visit (INDEPENDENT_AMBULATORY_CARE_PROVIDER_SITE_OTHER): Payer: Self-pay | Admitting: Orthopedic Surgery

## 2023-09-10 ENCOUNTER — Other Ambulatory Visit (INDEPENDENT_AMBULATORY_CARE_PROVIDER_SITE_OTHER): Payer: Self-pay

## 2023-09-10 ENCOUNTER — Other Ambulatory Visit: Payer: Self-pay | Admitting: Orthopedic Surgery

## 2023-09-10 DIAGNOSIS — M25522 Pain in left elbow: Secondary | ICD-10-CM

## 2023-09-10 DIAGNOSIS — Z8781 Personal history of (healed) traumatic fracture: Secondary | ICD-10-CM

## 2023-09-10 DIAGNOSIS — M25532 Pain in left wrist: Secondary | ICD-10-CM

## 2023-09-10 DIAGNOSIS — Z9889 Other specified postprocedural states: Secondary | ICD-10-CM

## 2023-09-10 MED ORDER — OXYCODONE HCL 5 MG PO TABS: 5.0000 mg | ORAL_TABLET | ORAL | 0 refills | Status: DC | PRN

## 2023-09-10 MED ORDER — CEPHALEXIN 500 MG PO CAPS: 500.0000 mg | ORAL_CAPSULE | Freq: Four times a day (QID) | ORAL | 0 refills | Status: AC

## 2023-09-10 NOTE — Progress Notes (Unsigned)
   Johnathan Bautista - 37 y.o. male MRN [id number]  Date of birth: 11-02-1986  Office Visit Note: Visit Date: 09/10/2023 PCP: Calvert Cantor, NP Referred by: Calvert Cantor, NP  Subjective:  HPI: Johnathan Bautista is a 37 y.o. male who presents today for follow up 1 week status post left wrist ORIF.  Pertinent ROS were reviewed with the patient and found to be negative unless otherwise specified above in HPI.   Assessment & Plan: Visit Diagnoses:  1. Pain in left wrist   2. Pain in left elbow     Plan: ***  Follow-up: No follow-ups on file.   Meds & Orders: No orders of the defined types were placed in this encounter.   Orders Placed This Encounter  Procedures   XR Wrist Complete Left   XR Elbow 2 Views Left     Procedures: No procedures performed       Objective:   Vital Signs: There were no vitals taken for this visit.  Ortho Exam ***  Imaging: No results found.   Ranyia Witting Trevor Mace, M.D. [facility] 2:51 PM

## 2023-09-10 NOTE — Telephone Encounter (Signed)
Patient will come in today to be seen
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
## 2023-09-17 ENCOUNTER — Other Ambulatory Visit: Payer: Self-pay

## 2023-09-17 ENCOUNTER — Ambulatory Visit: Payer: Self-pay | Admitting: Rehabilitative and Restorative Service Providers"

## 2023-09-17 ENCOUNTER — Ambulatory Visit (INDEPENDENT_AMBULATORY_CARE_PROVIDER_SITE_OTHER): Payer: Self-pay | Admitting: Orthopedic Surgery

## 2023-09-17 ENCOUNTER — Ambulatory Visit (INDEPENDENT_AMBULATORY_CARE_PROVIDER_SITE_OTHER): Payer: Self-pay

## 2023-09-17 ENCOUNTER — Encounter: Payer: Self-pay | Admitting: Rehabilitative and Restorative Service Providers"

## 2023-09-17 DIAGNOSIS — M25532 Pain in left wrist: Secondary | ICD-10-CM

## 2023-09-17 DIAGNOSIS — M25632 Stiffness of left wrist, not elsewhere classified: Secondary | ICD-10-CM

## 2023-09-17 DIAGNOSIS — R278 Other lack of coordination: Secondary | ICD-10-CM

## 2023-09-17 DIAGNOSIS — M6281 Muscle weakness (generalized): Secondary | ICD-10-CM

## 2023-09-17 DIAGNOSIS — R600 Localized edema: Secondary | ICD-10-CM

## 2023-09-17 NOTE — Progress Notes (Signed)
   Johnathan Bautista - 37 y.o. male MRN [id number]  Date of birth: 09/15/1986  Office Visit Note: Visit Date: 09/17/2023 PCP: Calvert Cantor, NP Referred by: Calvert Cantor, NP  Subjective:  HPI: Johnathan Bautista is a 37 y.o. male who presents today for follow up 2 weeks status post left wrist ORIF.  He is doing very well overall, pain is controlled.  Pertinent ROS were reviewed with the patient and found to be negative unless otherwise specified above in HPI.   Assessment & Plan: Visit Diagnoses:  1. Pain in left wrist     Plan: He will see occupational therapy today for fabrication of a removable wrist orthosis.  He can begin range of motion of the wrist and hand.  Follow-up in 4 weeks.  Will wait until the week 8 mark to begin strengthening.  Follow-up: No follow-ups on file.   Meds & Orders: No orders of the defined types were placed in this encounter.   Orders Placed This Encounter  Procedures   XR Wrist Complete Left     Procedures: No procedures performed       Objective:   Vital Signs: There were no vitals taken for this visit.  Ortho Exam Left upper extremity: - Well-healing volar incision, sutures removed, Steri-Strips applied, no erythema or drainage - Gentle wrist range of motion without significant pain, digital range of motion is well-preserved - Sensation intact in all distributions - Ongoing soreness along the ulnar aspect of the wrist with radiation up the forearm  Imaging: XR Wrist Complete Left  Result Date: 09/17/2023 X-rays of the left wrist, multiple views were obtained today X-rays demonstrate stable appearance of the orthopedic hardware with appropriate stabilization of the distal radius fracture.  Known ulnar styloid fracture is visualized once again.    Johnathan Bautista Johnathan Bautista, M.D. [facility] 9:29 AM
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
## 2023-09-24 ENCOUNTER — Encounter: Payer: Self-pay | Admitting: Rehabilitative and Restorative Service Providers"

## 2023-09-24 ENCOUNTER — Ambulatory Visit: Payer: Self-pay | Admitting: Rehabilitative and Restorative Service Providers"

## 2023-09-24 DIAGNOSIS — M25632 Stiffness of left wrist, not elsewhere classified: Secondary | ICD-10-CM

## 2023-09-24 DIAGNOSIS — M6281 Muscle weakness (generalized): Secondary | ICD-10-CM

## 2023-09-24 DIAGNOSIS — M25532 Pain in left wrist: Secondary | ICD-10-CM

## 2023-09-24 DIAGNOSIS — R278 Other lack of coordination: Secondary | ICD-10-CM

## 2023-09-24 DIAGNOSIS — R600 Localized edema: Secondary | ICD-10-CM
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
## 2023-10-01 ENCOUNTER — Encounter: Payer: Self-pay | Admitting: Rehabilitative and Restorative Service Providers"

## 2023-10-01 ENCOUNTER — Ambulatory Visit: Payer: Self-pay | Admitting: Rehabilitative and Restorative Service Providers"

## 2023-10-01 DIAGNOSIS — R600 Localized edema: Secondary | ICD-10-CM

## 2023-10-01 DIAGNOSIS — M25632 Stiffness of left wrist, not elsewhere classified: Secondary | ICD-10-CM

## 2023-10-01 DIAGNOSIS — M25532 Pain in left wrist: Secondary | ICD-10-CM

## 2023-10-01 DIAGNOSIS — M6281 Muscle weakness (generalized): Secondary | ICD-10-CM

## 2023-10-01 DIAGNOSIS — R278 Other lack of coordination: Secondary | ICD-10-CM

## 2023-10-08 ENCOUNTER — Telehealth: Payer: Self-pay | Admitting: Rehabilitative and Restorative Service Providers"

## 2023-10-08 ENCOUNTER — Encounter: Payer: Self-pay | Admitting: Rehabilitative and Restorative Service Providers"

## 2023-10-08 NOTE — Telephone Encounter (Signed)
OT called patient to discuss missed appointment. OT spoke with pt about next appointment date/time. They were reminded of the attendance policy and future missed appointments could lead to early discharge from therapy. He states doing well, was busy at work.  He was asked to start light isometric grip training with caution.
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
## 2023-10-14 ENCOUNTER — Encounter: Payer: Self-pay | Admitting: Rehabilitative and Restorative Service Providers"

## 2023-10-14 ENCOUNTER — Ambulatory Visit: Payer: Self-pay | Admitting: Rehabilitative and Restorative Service Providers"

## 2023-10-14 DIAGNOSIS — M6281 Muscle weakness (generalized): Secondary | ICD-10-CM

## 2023-10-14 DIAGNOSIS — R600 Localized edema: Secondary | ICD-10-CM

## 2023-10-14 DIAGNOSIS — R278 Other lack of coordination: Secondary | ICD-10-CM

## 2023-10-14 DIAGNOSIS — M25532 Pain in left wrist: Secondary | ICD-10-CM

## 2023-10-14 DIAGNOSIS — M25632 Stiffness of left wrist, not elsewhere classified: Secondary | ICD-10-CM

## 2023-10-18 ENCOUNTER — Encounter: Payer: Self-pay | Admitting: Orthopedic Surgery

## 2023-10-20 ENCOUNTER — Ambulatory Visit (INDEPENDENT_AMBULATORY_CARE_PROVIDER_SITE_OTHER): Payer: Self-pay | Admitting: Orthopedic Surgery

## 2023-10-20 ENCOUNTER — Other Ambulatory Visit (INDEPENDENT_AMBULATORY_CARE_PROVIDER_SITE_OTHER): Payer: Self-pay

## 2023-10-20 DIAGNOSIS — Z9889 Other specified postprocedural states: Secondary | ICD-10-CM

## 2023-10-20 DIAGNOSIS — Z8781 Personal history of (healed) traumatic fracture: Secondary | ICD-10-CM

## 2023-10-20 NOTE — Progress Notes (Signed)
   Johnathan Bautista - 37 y.o. male MRN [id number]  Date of birth: 02-10-1986  Office Visit Note: Visit Date: 10/20/2023 PCP: Calvert Cantor, NP Referred by: Calvert Cantor, NP  Subjective:  HPI: Johnathan Bautista is a 37 y.o. male who presents today for follow up 6 weeks status post left distal radius ORIF.  Pertinent ROS were reviewed with the patient and found to be negative unless otherwise specified above in HPI.   Assessment & Plan: Visit Diagnoses:  1. S/P ORIF (open reduction internal fixation) fracture     Plan: He is doing well postoperatively.  X-rays obtained today show stable appearance of distal radius fracture with hardware fixation and appropriate interval healing.  Continue with range of motion exercises for additional 2 weeks.  Wait until week 8 to begin strengthening.  Follow-up in 6 weeks.  Follow-up: No follow-ups on file.   Meds & Orders: No orders of the defined types were placed in this encounter.   Orders Placed This Encounter  Procedures   XR Wrist Complete Left     Procedures: No procedures performed       Objective:   Vital Signs: There were no vitals taken for this visit.  Ortho Exam Left wrist: - Well-healed volar incision - Flexion 35 degrees, extension 25 degrees, slightly improved passively - Full composite fist without restriction - Sensation intact in all distributions, hand is warm well-perfused  Imaging: XR Wrist Complete Left  Result Date: 10/20/2023 X-rays of the left wrist, multiple views were obtained today X-rays demonstrate stable appearance of the distal radius fracture with interval healing.  Hardware is well-fixed, no evidence of loosening or migration.  Distal ulna fracture is visualized as well with interval healing.    Foy Vanduyne Trevor Mace, M.D. [facility] 11:03 AM

## 2023-10-22 ENCOUNTER — Encounter: Payer: Self-pay | Admitting: Rehabilitative and Restorative Service Providers"

## 2023-10-22 ENCOUNTER — Telehealth: Payer: Self-pay | Admitting: Rehabilitative and Restorative Service Providers"

## 2023-10-22 NOTE — Telephone Encounter (Signed)
OT called patient to discuss missed appointment. OT spoke with pt about next appointment date/time, made new apt for 19th at 10:15am. They were reminded of the attendance policy and future missed appointments could lead to early discharge from therapy.
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
## 2023-10-26 ENCOUNTER — Encounter: Payer: Self-pay | Admitting: Rehabilitative and Restorative Service Providers"

## 2023-10-26 ENCOUNTER — Ambulatory Visit: Payer: Self-pay | Admitting: Rehabilitative and Restorative Service Providers"

## 2023-10-26 DIAGNOSIS — M25632 Stiffness of left wrist, not elsewhere classified: Secondary | ICD-10-CM

## 2023-10-26 DIAGNOSIS — M6281 Muscle weakness (generalized): Secondary | ICD-10-CM

## 2023-10-26 DIAGNOSIS — R278 Other lack of coordination: Secondary | ICD-10-CM

## 2023-10-26 DIAGNOSIS — R600 Localized edema: Secondary | ICD-10-CM

## 2023-10-26 DIAGNOSIS — M25532 Pain in left wrist: Secondary | ICD-10-CM

## 2023-11-01 ENCOUNTER — Telehealth: Payer: Self-pay | Admitting: Rehabilitative and Restorative Service Providers"

## 2023-11-01 ENCOUNTER — Encounter: Payer: Self-pay | Admitting: Rehabilitative and Restorative Service Providers"

## 2023-11-01 NOTE — Telephone Encounter (Signed)
OT called patient to discuss 3rd missed appointment. OT left message with pt about next appointment date/time. He was told that if he misses again, he will unfortunately be discharged from therapy per policy.

## 2023-11-07 ENCOUNTER — Emergency Department (HOSPITAL_COMMUNITY)
Admission: EM | Admit: 2023-11-07 | Discharge: 2023-11-08 | Disposition: A | Discharge status: Home / Self Care | Payer: Self-pay | Attending: Emergency Medicine | Admitting: Emergency Medicine

## 2023-11-07 ENCOUNTER — Other Ambulatory Visit: Payer: Self-pay

## 2023-11-07 ENCOUNTER — Encounter (HOSPITAL_COMMUNITY): Payer: Self-pay

## 2023-11-07 DIAGNOSIS — K760 Fatty (change of) liver, not elsewhere classified: Secondary | ICD-10-CM | POA: Insufficient documentation

## 2023-11-07 DIAGNOSIS — R160 Hepatomegaly, not elsewhere classified: Secondary | ICD-10-CM | POA: Insufficient documentation

## 2023-11-07 DIAGNOSIS — K6289 Other specified diseases of anus and rectum: Secondary | ICD-10-CM | POA: Insufficient documentation

## 2023-11-07 LAB — CBC WITH DIFFERENTIAL/PLATELET
Abs Immature Granulocytes: 0.02 10*3/uL (ref 0.00–0.07)
Basophils Absolute: 0 10*3/uL (ref 0.0–0.1)
Basophils Relative: 0 %
Eosinophils Absolute: 0.1 10*3/uL (ref 0.0–0.5)
Eosinophils Relative: 1 %
HCT: 40.1 % (ref 39.0–52.0)
Hemoglobin: 13.8 g/dL (ref 13.0–17.0)
Immature Granulocytes: 0 %
Lymphocytes Relative: 22 %
Lymphs Abs: 2.2 10*3/uL (ref 0.7–4.0)
MCH: 31.5 pg (ref 26.0–34.0)
MCHC: 34.4 g/dL (ref 30.0–36.0)
MCV: 91.6 fL (ref 80.0–100.0)
Monocytes Absolute: 0.8 10*3/uL (ref 0.1–1.0)
Monocytes Relative: 8 %
Neutro Abs: 6.9 10*3/uL (ref 1.7–7.7)
Neutrophils Relative %: 69 %
Platelets: 209 10*3/uL (ref 150–400)
RBC: 4.38 MIL/uL (ref 4.22–5.81)
RDW: 12.2 % (ref 11.5–15.5)
WBC: 10 10*3/uL (ref 4.0–10.5)
nRBC: 0 % (ref 0.0–0.2)

## 2023-11-07 LAB — COMPREHENSIVE METABOLIC PANEL
ALT: 21 U/L (ref 0–44)
AST: 19 U/L (ref 15–41)
Albumin: 4 g/dL (ref 3.5–5.0)
Alkaline Phosphatase: 55 U/L (ref 38–126)
Anion gap: 10 (ref 5–15)
BUN: 16 mg/dL (ref 6–20)
CO2: 26 mmol/L (ref 22–32)
Calcium: 9.3 mg/dL (ref 8.9–10.3)
Chloride: 104 mmol/L (ref 98–111)
Creatinine, Ser: 0.86 mg/dL (ref 0.61–1.24)
GFR, Estimated: >60 mL/min (ref >60)
Glucose, Bld: 101 mg/dL — ABNORMAL HIGH (ref 70–99)
Potassium: 3.4 mmol/L — ABNORMAL LOW (ref 3.5–5.1)
Sodium: 140 mmol/L (ref 135–145)
Total Bilirubin: 0.5 mg/dL (ref <1.2)
Total Protein: 6.9 g/dL (ref 6.5–8.1)

## 2023-11-07 MED ORDER — HYDROMORPHONE HCL 1 MG/ML IJ SOLN
1.0000 mg | Freq: Once | INTRAMUSCULAR | Status: AC
  Administered 2023-11-07: 1 mg via INTRAVENOUS
  Filled 2023-11-07: qty 1

## 2023-11-07 NOTE — ED Triage Notes (Signed)
Pt reports rectal pain since this morning.  Pt has been having normal BM's and is unsure if he has hemorrhoids and denies any bleeding.

## 2023-11-08 ENCOUNTER — Emergency Department (HOSPITAL_COMMUNITY): Payer: Self-pay

## 2023-11-08 MED ORDER — HYDROCORTISONE ACETATE 25 MG RE SUPP: 25.0000 mg | Freq: Two times a day (BID) | RECTAL | 0 refills | Status: AC

## 2023-11-08 MED ORDER — DOCUSATE SODIUM 100 MG PO CAPS: 100.0000 mg | ORAL_CAPSULE | Freq: Two times a day (BID) | ORAL | 0 refills | Status: AC

## 2023-11-08 MED ORDER — OXYCODONE-ACETAMINOPHEN 5-325 MG PO TABS
2.0000 | ORAL_TABLET | Freq: Once | ORAL | Status: AC
  Administered 2023-11-08: 2 via ORAL
  Filled 2023-11-08: qty 2

## 2023-11-08 MED ORDER — OXYCODONE HCL 5 MG PO TABS: 5.0000 mg | ORAL_TABLET | Freq: Four times a day (QID) | ORAL | 0 refills | Status: AC | PRN

## 2023-11-08 MED ORDER — IOHEXOL 300 MG/ML  SOLN
100.0000 mL | Freq: Once | INTRAMUSCULAR | Status: AC | PRN
  Administered 2023-11-08: 100 mL via INTRAVENOUS

## 2023-11-08 NOTE — ED Provider Notes (Signed)
[place] EMERGENCY DEPARTMENT AT [facility] Provider Note   CSN: [id number] Arrival date & time: 11/07/23  2226     History  Chief Complaint  Patient presents with   Rectal Pain    Johnathan Bautista is a 37 y.o. male.  37 year old male presents ER today with acute onset of rectal pain.  Patient states that he woke up this morning with it and progressively worsened through the day but the last 4 hours has been the worst.  States that seems to be more on the right side.  Hurts with a bowel movement hurts with sitting, walking hurts with everything.  No history of hemorrhoids.  No rectal bleeding.  No history of cancer.  No annual insertion.  No recent trauma.  No recent fevers, urinary symptoms.  Has not had any that this before.  Tried A&D ointment at home without relief.        Home Medications Prior to Admission medications   Medication Sig Start Date End Date Taking? Authorizing Provider  docusate sodium (COLACE) 100 MG capsule Take 1 capsule (100 mg total) by mouth every 12 (twelve) hours. 11/08/23  Yes Jceon Alverio, Barbara Cower, MD  hydrocortisone (ANUSOL-HC) 25 MG suppository Place 1 suppository (25 mg total) rectally 2 (two) times daily. 11/08/23  Yes Sabrinia Prien, Barbara Cower, MD  oxyCODONE (ROXICODONE) 5 MG immediate release tablet Take 1 tablet (5 mg total) by mouth every 6 (six) hours as needed for breakthrough pain. 11/08/23  Yes Taresa [place], Barbara Cower, MD  ibuprofen (ADVIL) 800 MG tablet Take 1 tablet (800 mg total) by mouth every 8 (eight) hours as needed for moderate pain. 07/11/23   Bethann Berkshire, MD  Rimegepant Sulfate (NURTEC) 75 MG TBDP Take 1 tablet (75 mg total) by mouth daily as needed. For migraines. Take as close to onset of migraine as possible. One daily maximum. 07/20/23   Anson Fret, MD  SUMAtriptan (IMITREX) 50 MG tablet Take 1 tablet at first sign of migraine. May repeat in 2 hours if headache persists or recurs. Max of 2 tabs daily 07/02/23   Particia Nearing, PA-C       Allergies    Fish allergy    Review of Systems   Review of Systems  Physical Exam Updated Vital Signs BP 126/86   Pulse 75   Temp 98.5 F (36.9 C) (Temporal)   Resp 16   Ht 5\' 8"  (1.727 m)   Wt 93 kg   SpO2 97%   BMI 31.17 kg/m  Physical Exam Vitals and nursing note reviewed.  Constitutional:      Appearance: He is well-developed.  HENT:     Head: Normocephalic and atraumatic.  Cardiovascular:     Rate and Rhythm: Normal rate.  Pulmonary:     Effort: Pulmonary effort is normal. No respiratory distress.  Abdominal:     General: There is no distension.  Genitourinary:    Comments: Patient tender right distal rectum but no palpable masses, abscesses, hemorrhoids or induration.  Nothing externally to explain his symptoms. Musculoskeletal:        General: Normal range of motion.     Cervical back: Normal range of motion.  Neurological:     Mental Status: He is alert.     ED Results / Procedures / Treatments   Labs (all labs ordered are listed, but only abnormal results are displayed) Labs Reviewed  COMPREHENSIVE METABOLIC PANEL - Abnormal; Notable for the following components:      Result Value  Potassium 3.4 (*)    Glucose, Bld 101 (*)    All other components within normal limits  CBC WITH DIFFERENTIAL/PLATELET    EKG None  Radiology CT ABDOMEN PELVIS W CONTRAST  Result Date: 11/08/2023 CLINICAL DATA:  Rectal pain without rectal bleeding. EXAM: CT ABDOMEN AND PELVIS WITH CONTRAST TECHNIQUE: Multidetector CT imaging of the abdomen and pelvis was performed using the standard protocol following bolus administration of intravenous contrast. RADIATION DOSE REDUCTION: This exam was performed according to the departmental dose-optimization program which includes automated exposure control, adjustment of the mA and/or kV according to patient size and/or use of iterative reconstruction technique. CONTRAST:  OMNIPAQUE IOHEXOL 300 MG/ML  SOLN COMPARISON:  CT  chest, abdomen and pelvis 05/01/2016, with IV contrast. FINDINGS: Lower chest: There is posterior atelectasis in lower lobes without infiltrates. Lung bases are otherwise clear. The cardiac size is normal. Hepatobiliary: The liver is 18.5 cm length and slightly steatotic. There is no mass enhancement. The gallbladder and bile ducts are unremarkable. Pancreas: No abnormality. Spleen: No abnormality.  No splenomegaly. Adrenals/Urinary Tract: Adrenal glands are unremarkable. Kidneys are normal, without renal calculi, focal lesion, or hydronephrosis. Bladder is unremarkable. Stomach/Bowel: No dilatation or wall thickening, including the retrocecal appendix, and occluding the rectal wall as well as can be seen allowing for nondistention and the lack of enteric contrast. Scattered uncomplicated sigmoid diverticulosis. Vascular/Lymphatic: No significant vascular findings are present. No enlarged abdominal or pelvic lymph nodes. Reproductive: Prostate is unremarkable. Both testicles are in the scrotal sac. Other: No abdominal wall hernia or abnormality. No abdominopelvic ascites. Musculoskeletal: There is a mild chronic anterior wedge compression fracture of L2 vertebral body, which was acute in 2017 and shows no appreciable progression. Slight lumbar levoscoliosis. No acute or other significant osseous findings. There is interval increased right paracentral disc protrusion at L4-5, which may compromise the descending right L5 nerve root. Clinical correlation advised. IMPRESSION: 1. No acute CT findings in the abdomen or pelvis. 2. Slightly enlarged and steatotic liver. 3. Sigmoid diverticulosis. 4. Interval increased right paracentral disc protrusion at L4-5, which may compromise the descending right L5 nerve root. 5. Chronic mild anterior wedge compression fracture of L2 vertebral body. Electronically Signed   By: Almira Bar M.D.   On: 11/08/2023 01:05    Procedures Procedures    Medications Ordered in  ED Medications  oxyCODONE-acetaminophen (PERCOCET/ROXICET) 5-325 MG per tablet 2 tablet (has no administration in time range)  HYDROmorphone (DILAUDID) injection 1 mg (1 mg Intravenous Given 11/07/23 2338)  iohexol (OMNIPAQUE) 300 MG/ML solution 100 mL (100 mLs Intravenous Contrast Given 11/08/23 0017)    ED Course/ Medical Decision Making/ A&P Clinical Course as of 11/08/23 1610  [facility] Nov 08, 2023  0027 CT ABDOMEN PELVIS W CONTRAST Nothing super obvious on my interpretation in the area of the distal rectum, possibly some mild stranding but not asymmetric. No obvious abscess, pending radiology read.  [JM]    Clinical Course User Index [JM] Teran Knittle, Barbara Cower, MD                                 Medical Decision Making Amount and/or Complexity of Data Reviewed Labs: ordered. Radiology: ordered. Decision-making details documented in ED Course.  Risk OTC drugs. Prescription drug management.  Patient with mild distal rectal pain.  Nothing palpable on exam although is tender.  Will proceed with labs, CT and symptomatic treatment. Labs and ct reassuring. Possible early  proctitis. Will recommend colace/anusol w/ short course of pain meds. Also has hepatic steatosis on ct, recommended weight loss, healthy diet and GI follow up for same.   Final Clinical Impression(s) / ED Diagnoses Final diagnoses:  Rectal pain  Hepatomegaly  Hepatic steatosis    Rx / DC Orders ED Discharge Orders          Ordered    oxyCODONE (ROXICODONE) 5 MG immediate release tablet  Every 6 hours PRN        11/08/23 0217    docusate sodium (COLACE) 100 MG capsule  Every 12 hours        11/08/23 0217    hydrocortisone (ANUSOL-HC) 25 MG suppository  2 times daily        11/08/23 0217    Ambulatory referral to Gastroenterology        11/08/23 0217              Marily Memos, MD 11/08/23 [phone]

## 2023-11-08 NOTE — ED Notes (Signed)
Patient transported to CT 

## 2023-11-08 NOTE — ED Notes (Signed)
ED Provider at bedside. 
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
## 2023-11-10 ENCOUNTER — Ambulatory Visit: Payer: Self-pay | Admitting: Rehabilitative and Restorative Service Providers"

## 2023-11-10 ENCOUNTER — Encounter: Payer: Self-pay | Admitting: Rehabilitative and Restorative Service Providers"

## 2023-11-10 DIAGNOSIS — M6281 Muscle weakness (generalized): Secondary | ICD-10-CM

## 2023-11-10 DIAGNOSIS — R600 Localized edema: Secondary | ICD-10-CM

## 2023-11-10 DIAGNOSIS — M25632 Stiffness of left wrist, not elsewhere classified: Secondary | ICD-10-CM

## 2023-11-10 DIAGNOSIS — M25532 Pain in left wrist: Secondary | ICD-10-CM

## 2023-11-10 DIAGNOSIS — R278 Other lack of coordination: Secondary | ICD-10-CM
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
## 2023-11-11 ENCOUNTER — Encounter: Payer: Self-pay | Admitting: Gastroenterology

## 2023-11-11 ENCOUNTER — Ambulatory Visit (INDEPENDENT_AMBULATORY_CARE_PROVIDER_SITE_OTHER): Payer: Self-pay | Admitting: Gastroenterology

## 2023-11-11 VITALS — BP 108/68 | HR 71 | Temp 98.0°F | Ht 68.0 in | Wt 219.6 lb

## 2023-11-11 DIAGNOSIS — K6289 Other specified diseases of anus and rectum: Secondary | ICD-10-CM

## 2023-11-11 DIAGNOSIS — K610 Anal abscess: Secondary | ICD-10-CM

## 2023-11-11 NOTE — Patient Instructions (Signed)
I suspect you may have had an infected hair follicle that has ruptured and drained. This is good news and the little residual opening from drainage should heal over the next few days. If you have any worsening symptoms, persistent drainage, or develop swelling in the area, please let me know.   For now, I would recommend just keeping the area clean and dry.  You can also do warm water soaks as well to sooth discomfort.   Ermalinda Memos, PA-C [facility]
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
## 2023-11-17 ENCOUNTER — Encounter: Payer: Self-pay | Admitting: Rehabilitative and Restorative Service Providers"

## 2023-11-17 ENCOUNTER — Ambulatory Visit: Payer: Self-pay | Admitting: Rehabilitative and Restorative Service Providers"

## 2023-11-17 DIAGNOSIS — M25632 Stiffness of left wrist, not elsewhere classified: Secondary | ICD-10-CM

## 2023-11-17 DIAGNOSIS — M6281 Muscle weakness (generalized): Secondary | ICD-10-CM

## 2023-11-17 DIAGNOSIS — M25532 Pain in left wrist: Secondary | ICD-10-CM

## 2023-11-17 DIAGNOSIS — R278 Other lack of coordination: Secondary | ICD-10-CM

## 2023-11-17 DIAGNOSIS — R600 Localized edema: Secondary | ICD-10-CM
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
## 2023-11-24 ENCOUNTER — Ambulatory Visit: Payer: Self-pay | Admitting: Rehabilitative and Restorative Service Providers"

## 2023-11-24 ENCOUNTER — Encounter: Payer: Self-pay | Admitting: Rehabilitative and Restorative Service Providers"

## 2023-11-24 DIAGNOSIS — R278 Other lack of coordination: Secondary | ICD-10-CM

## 2023-11-24 DIAGNOSIS — M6281 Muscle weakness (generalized): Secondary | ICD-10-CM

## 2023-11-24 DIAGNOSIS — M25532 Pain in left wrist: Secondary | ICD-10-CM

## 2023-11-24 DIAGNOSIS — R600 Localized edema: Secondary | ICD-10-CM

## 2023-11-24 DIAGNOSIS — M25632 Stiffness of left wrist, not elsewhere classified: Secondary | ICD-10-CM

## 2023-11-29 ENCOUNTER — Ambulatory Visit: Payer: Self-pay | Admitting: Orthopedic Surgery

## 2023-12-06 ENCOUNTER — Encounter: Payer: Self-pay | Admitting: Orthopedic Surgery

## 2024-07-28 ENCOUNTER — Encounter: Payer: Self-pay | Admitting: Radiology

## 2024-07-31 ENCOUNTER — Ambulatory Visit
Admission: EM | Admit: 2024-07-31 | Discharge: 2024-07-31 | Disposition: A | Discharge status: Home / Self Care | Payer: Self-pay | Attending: Nurse Practitioner | Admitting: Nurse Practitioner

## 2024-07-31 DIAGNOSIS — J069 Acute upper respiratory infection, unspecified: Secondary | ICD-10-CM

## 2024-07-31 DIAGNOSIS — R0989 Other specified symptoms and signs involving the circulatory and respiratory systems: Secondary | ICD-10-CM

## 2024-07-31 DIAGNOSIS — J029 Acute pharyngitis, unspecified: Secondary | ICD-10-CM

## 2024-07-31 LAB — POCT RAPID STREP A (OFFICE): Rapid Strep A Screen: NEGATIVE

## 2024-07-31 LAB — POC COVID19/FLU A&B COMBO
Covid Antigen, POC: NEGATIVE
Influenza A Antigen, POC: NEGATIVE
Influenza B Antigen, POC: NEGATIVE

## 2024-07-31 MED ORDER — PROMETHAZINE-DM 6.25-15 MG/5ML PO SYRP: 5.0000 mL | ORAL_SOLUTION | Freq: Four times a day (QID) | ORAL | 0 refills | Status: AC | PRN

## 2024-07-31 MED ORDER — FLUTICASONE PROPIONATE 50 MCG/ACT NA SUSP: 2.0000 | Freq: Every day | NASAL | 0 refills | Status: AC

## 2024-07-31 MED ORDER — CETIRIZINE-PSEUDOEPHEDRINE ER 5-120 MG PO TB12: 1.0000 | ORAL_TABLET | Freq: Every day | ORAL | 0 refills | Status: AC
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
## 2024-07-31 NOTE — ED Triage Notes (Signed)
 Patient presents with sore throat,cough and nasal congestion x 4 days. Patient stated with chills last night. Patient took Dayquil yesterday.

## 2024-07-31 NOTE — Discharge Instructions (Addendum)
 The rapid strep test and COVID/flu test were negative. Take medication as prescribed. Increase fluids and allow for plenty of rest. You may take over-the-counter Tylenol  or ibuprofen  as needed for pain, fever, or general discomfort. Recommend the use of normal saline nasal spray throughout the day for nasal congestion and runny nose. For your cough, recommend use of a humidifier in your bedroom at nighttime during sleep and sleeping elevated while symptoms persist. You may use warm salt water gargles 3-4 times daily as needed for throat pain or discomfort.  Also recommend Chloraseptic throat spray or throat lozenges while symptoms persist. As discussed, symptoms should improve over the next 5 to 7 days.  If symptoms fail to improve, or begin to worsen, he may follow-up in this clinic or with your primary care physician for further evaluation. Follow-up as needed.

## 2024-10-09 ENCOUNTER — Encounter: Payer: Self-pay | Admitting: Radiology
